# Patient Record
Sex: Male | Born: 1950 | Race: White | Hispanic: No | Marital: Married | Smoking: Former smoker
Health system: Southern US, Community
[De-identification: ages and names within clinical notes are randomized; demographics above are authoritative.]

## PROBLEM LIST (undated history)

## (undated) DIAGNOSIS — M7050 Other bursitis of knee, unspecified knee: Secondary | ICD-10-CM

## (undated) DIAGNOSIS — H6990 Unspecified Eustachian tube disorder, unspecified ear: Secondary | ICD-10-CM

## (undated) DIAGNOSIS — L409 Psoriasis, unspecified: Secondary | ICD-10-CM

## (undated) DIAGNOSIS — A6000 Herpesviral infection of urogenital system, unspecified: Secondary | ICD-10-CM

## (undated) DIAGNOSIS — G4700 Insomnia, unspecified: Secondary | ICD-10-CM

## (undated) DIAGNOSIS — J40 Bronchitis, not specified as acute or chronic: Secondary | ICD-10-CM

## (undated) DIAGNOSIS — E559 Vitamin D deficiency, unspecified: Secondary | ICD-10-CM

## (undated) DIAGNOSIS — K649 Unspecified hemorrhoids: Secondary | ICD-10-CM

## (undated) DIAGNOSIS — E785 Hyperlipidemia, unspecified: Secondary | ICD-10-CM

## (undated) DIAGNOSIS — K219 Gastro-esophageal reflux disease without esophagitis: Secondary | ICD-10-CM

## (undated) DIAGNOSIS — R5383 Other fatigue: Secondary | ICD-10-CM

## (undated) DIAGNOSIS — R319 Hematuria, unspecified: Secondary | ICD-10-CM

## (undated) DIAGNOSIS — I10 Essential (primary) hypertension: Secondary | ICD-10-CM

## (undated) DIAGNOSIS — N529 Male erectile dysfunction, unspecified: Secondary | ICD-10-CM

## (undated) DIAGNOSIS — H6980 Other specified disorders of Eustachian tube, unspecified ear: Secondary | ICD-10-CM

## (undated) DIAGNOSIS — G44009 Cluster headache syndrome, unspecified, not intractable: Secondary | ICD-10-CM

## (undated) DIAGNOSIS — B351 Tinea unguium: Secondary | ICD-10-CM

## (undated) HISTORY — DX: Male erectile dysfunction, unspecified: N52.9

## (undated) HISTORY — DX: Vitamin D deficiency, unspecified: E55.9

## (undated) HISTORY — DX: Herpesviral infection of urogenital system, unspecified: A60.00

## (undated) HISTORY — DX: Other specified disorders of Eustachian tube, unspecified ear: H69.80

## (undated) HISTORY — DX: Tinea unguium: B35.1

## (undated) HISTORY — DX: Hyperlipidemia, unspecified: E78.5

## (undated) HISTORY — PX: UMBILICAL HERNIA REPAIR: SHX196

## (undated) HISTORY — DX: Insomnia, unspecified: G47.00

## (undated) HISTORY — DX: Unspecified hemorrhoids: K64.9

## (undated) HISTORY — DX: Cluster headache syndrome, unspecified, not intractable: G44.009

## (undated) HISTORY — DX: Hematuria, unspecified: R31.9

## (undated) HISTORY — DX: Unspecified eustachian tube disorder, unspecified ear: H69.90

## (undated) HISTORY — PX: SHOULDER SURGERY: SHX246

## (undated) HISTORY — DX: Psoriasis, unspecified: L40.9

## (undated) HISTORY — DX: Other fatigue: R53.83

## (undated) HISTORY — DX: Essential (primary) hypertension: I10

## (undated) HISTORY — DX: Gastro-esophageal reflux disease without esophagitis: K21.9

## (undated) HISTORY — DX: Bronchitis, not specified as acute or chronic: J40

## (undated) HISTORY — DX: Other bursitis of knee, unspecified knee: M70.50

---

## 2005-11-27 ENCOUNTER — Ambulatory Visit: Payer: Self-pay | Admitting: Gastroenterology

## 2005-11-27 LAB — HM COLONOSCOPY: HM COLON: NORMAL

## 2006-06-23 ENCOUNTER — Ambulatory Visit: Payer: Self-pay | Admitting: Gastroenterology

## 2013-03-16 LAB — BASIC METABOLIC PANEL
BUN: 17 mg/dL (ref 4–21)
Creatinine: 1 mg/dL (ref <=1.3)
GLUCOSE: 95 mg/dL
Potassium: 4.7 mmol/L (ref 3.4–5.3)
SODIUM: 139 mmol/L (ref 137–147)

## 2013-03-16 LAB — HEPATIC FUNCTION PANEL
ALT: 28 U/L (ref 10–40)
AST: 22 U/L (ref 14–40)
Alkaline Phosphatase: 62 U/L (ref 25–125)
Bilirubin, Total: 0.5 mg/dL

## 2013-03-16 LAB — LIPID PANEL
Cholesterol: 211 mg/dL — AB (ref 0–200)
HDL: 45 mg/dL (ref 35–70)
LDL Cholesterol: 134 mg/dL
LDl/HDL Ratio: 3

## 2013-03-16 LAB — CBC AND DIFFERENTIAL
HCT: 42 % (ref 41–53)
Hemoglobin: 14.5 g/dL (ref 13.5–17.5)
PLATELETS: 187 10*3/uL (ref 150–399)
WBC: 5.7 10^3/mL

## 2013-03-16 LAB — TSH: TSH: 1.75 u[IU]/mL (ref <=5.90)

## 2013-06-19 ENCOUNTER — Ambulatory Visit: Payer: Self-pay | Admitting: Orthopedic Surgery

## 2014-05-31 ENCOUNTER — Ambulatory Visit
Admit: 2014-05-31 | Disposition: A | Discharge status: Home / Self Care | Payer: Self-pay | Attending: Gastroenterology | Admitting: Gastroenterology

## 2014-06-13 ENCOUNTER — Encounter: Payer: Self-pay | Admitting: Emergency Medicine

## 2014-07-19 ENCOUNTER — Ambulatory Visit (INDEPENDENT_AMBULATORY_CARE_PROVIDER_SITE_OTHER): Payer: 59 | Admitting: Family Medicine

## 2014-07-19 ENCOUNTER — Encounter: Payer: Self-pay | Admitting: Family Medicine

## 2014-07-19 VITALS — BP 130/80 | HR 72 | Temp 98.1°F | Resp 16 | Wt 198.0 lb

## 2014-07-19 DIAGNOSIS — K219 Gastro-esophageal reflux disease without esophagitis: Secondary | ICD-10-CM | POA: Diagnosis not present

## 2014-07-19 DIAGNOSIS — I10 Essential (primary) hypertension: Secondary | ICD-10-CM

## 2014-07-19 DIAGNOSIS — N529 Male erectile dysfunction, unspecified: Secondary | ICD-10-CM

## 2014-07-19 DIAGNOSIS — G4700 Insomnia, unspecified: Secondary | ICD-10-CM

## 2014-07-19 NOTE — Progress Notes (Signed)
Hypertension, follow-up:    BP Readings from Last 3 Encounters:  07/19/14 130/80  05/19/14 100/60     He was last seen for hypertension 1 years ago.   BP at that visit was 100/60.  Management changes since that visit include none. He reports good compliance with treatment.  He is not having side effects.   He is exercising.  He is adherent to low salt diet.    Outside blood pressures are not being checked.  He is experiencing none.   Patient denies none.    Cardiovascular risk factors include hypertension.    Weight trend: stable Current diet: not asked  Filed Vitals:   07/19/14 1551  BP: 130/80  Pulse: 72  Temp: 98.1 F (36.7 C)  Resp: 16   Name: Eduardo Patrick   MRN: [id number]    DOB: [birth year]   Date:07/19/2014        Progress Note  Subjective      Chief Complaint  Chief Complaint  Patient presents with  . Hypertension    HPI  Patient is a pleasant 64 year old gentleman that presents today for follow-up for hypertension. He is doing well and has no complaints. No cardiovascular at all. symptoms His blood pressures at home of been good. No problem-specific assessment & plan notes found for this encounter.   Past Medical History  Diagnosis Date  . GERD (gastroesophageal reflux disease)   . Hyperlipidemia   . Hypertension   . Psoriasis   . Hemorrhoid   . Onychomycosis   . Hematuria   . Erectile dysfunction   . Cluster headache   . Fatigue   . Bronchitis   . Vitamin D deficiency   . Insomnia   . Genital herpes   . Eustachian tube dysfunction   . Knee bursitis     Past Surgical History  Procedure Laterality Date  . Umbilical hernia repair    . Shoulder surgery      Family History  Problem Relation Age of Onset  . Hyperlipidemia Mother   . Glaucoma Mother   . Pulmonary fibrosis Mother   . Heart attack Father   . Other Brother     Enlarged prostate    History   Social History  . Marital Status: Married    Spouse Name: N/A   . Number of Children: N/A  . Years of Education: N/A   Occupational History  . Not on file.   Social History Main Topics  . Smoking status: Former Research scientist (life sciences)  . Smokeless tobacco: Not on file     Comment: Pt quit 36-38 years ago  . Alcohol Use: No  . Drug Use: No  . Sexual Activity: Not on file   Other Topics Concern  . Not on file   Social History Narrative     Current outpatient prescriptions:  .  acyclovir (ZOVIRAX) 200 MG capsule, Take 200 mg by mouth 2 (two) times daily., Disp: , Rfl:  .  cholecalciferol (VITAMIN D) 1000 UNITS tablet, Take 2,000 Units by mouth daily., Disp: , Rfl:  .  ibuprofen (ADVIL,MOTRIN) 800 MG tablet, Take 800 mg by mouth 3 (three) times daily., Disp: , Rfl:  .  lisinopril (PRINIVIL,ZESTRIL) 10 MG tablet, Take 10 mg by mouth every other day. , Disp: , Rfl:  .  Multiple Vitamin (MULTIVITAMIN) capsule, Take 1 capsule by mouth daily., Disp: , Rfl:  .  terbinafine (LAMISIL) 250 MG tablet, Take 250 mg by mouth daily., Disp: , Rfl:  .  LORazepam (ATIVAN) 0.5 MG tablet, Take 0.5 mg by mouth every 8 (eight) hours. 1/2 at bedtime, Disp: , Rfl:   Allergies  Allergen Reactions  . Sulfa Antibiotics Other (See Comments)    Unknown reaction     Review of Systems  Constitutional: Negative.   HENT: Negative.   Eyes: Negative.   Respiratory: Negative.   Cardiovascular: Negative.   Gastrointestinal: Negative.   Genitourinary: Negative.   Musculoskeletal: Negative.   Skin: Negative.   Neurological: Negative.   Endo/Heme/Allergies: Negative.   Psychiatric/Behavioral: Negative.       Objective  Filed Vitals:   07/19/14 1551  BP: 130/80  Pulse: 72  Temp: 98.1 F (36.7 C)  TempSrc: Oral  Resp: 16  Weight: 198 lb (89.812 kg)    Physical Exam  Constitutional: He is well-developed, well-nourished, and in no distress.  HENT:  Head: Normocephalic and atraumatic.  Eyes: Conjunctivae and EOM are normal. Pupils are equal, round, and reactive to light.   Neck: Normal range of motion. Neck supple.  Cardiovascular: Normal rate, regular rhythm, normal heart sounds and intact distal pulses.   Pulmonary/Chest: Effort normal and breath sounds normal.  Abdominal: Soft. Bowel sounds are normal.  Neurological: He is alert. Gait normal.  Skin: Skin is warm.  Psychiatric: Mood, memory, affect and judgment normal.  Vitals reviewed.      No results found for this or any previous visit (from the past 2160 hour(s)).   Assessment & Plan  Problem List Items Addressed This Visit      Cardiovascular and Mediastinum   Hypertension - Primary     Digestive   GERD (gastroesophageal reflux disease)     Genitourinary   Erectile dysfunction     Other   Insomnia     all problems stable. Recheck 6 months.  Meds ordered this encounter  Medications  . terbinafine (LAMISIL) 250 MG tablet    Sig: Take 250 mg by mouth daily.

## 2014-09-06 ENCOUNTER — Encounter: Payer: Self-pay | Admitting: Family Medicine

## 2014-09-06 ENCOUNTER — Ambulatory Visit (INDEPENDENT_AMBULATORY_CARE_PROVIDER_SITE_OTHER): Payer: 59 | Admitting: Family Medicine

## 2014-09-06 VITALS — BP 118/70 | HR 65 | Temp 98.1°F | Resp 16 | Wt 198.6 lb

## 2014-09-06 DIAGNOSIS — J069 Acute upper respiratory infection, unspecified: Secondary | ICD-10-CM | POA: Diagnosis not present

## 2014-09-06 NOTE — Progress Notes (Signed)
Subjective:     Patient ID: Eduardo Patrick, male   DOB: Apr 10, 1950, 64 y.o.   MRN: [id number]  HPI  Chief Complaint  Patient presents with  . Cough    Patient comes in office today with concerns of productive cough and congestion since Monday. Patient symptoms initially started out as sore throat and body chills, both of those symptoms are now gone. Patient reports feeling fatigued, yesterday he states he slept for 19hrs  Has not documented a fever at home: "I just came off a vacation". Has been using Mucinex and Robitussin DM for his sx.   Review of Systems     Objective:   Physical Exam  Constitutional: He appears well-developed and well-nourished. No distress.  Ears: T.M's intact without inflammation Throat: no tonsillar enlargement or exudate Neck: no cervical adenopathy Lungs: clear     Assessment:    1. Upper respiratory infection     Plan:    Discussed use of Mucinex D for congestion, Delsym for cough, and Benadryl for postnasal drainage

## 2014-09-06 NOTE — Patient Instructions (Signed)
Discussed use of Mucinex D for congestion, Delsym for cough, and Benadryl for postnasal drainage 

## 2014-09-07 ENCOUNTER — Telehealth: Payer: Self-pay | Admitting: Family Medicine

## 2014-09-07 ENCOUNTER — Other Ambulatory Visit: Payer: Self-pay | Admitting: Family Medicine

## 2014-09-07 DIAGNOSIS — J069 Acute upper respiratory infection, unspecified: Secondary | ICD-10-CM

## 2014-09-07 MED ORDER — HYDROCODONE-HOMATROPINE 5-1.5 MG/5ML PO SYRP: ORAL_SOLUTION | ORAL | Status: DC

## 2014-09-07 NOTE — Telephone Encounter (Signed)
Patient has been advised that Rx is available for pick up he will have his wife pick up Rx. KW

## 2014-09-07 NOTE — Telephone Encounter (Signed)
Pt called syaing he was still coughing a lot.  Was just in Thursday.  Needs a cough med.  Generic please.  Use [facility].  Eduardo Patrick

## 2014-09-07 NOTE — Telephone Encounter (Signed)
Patient was just seen in office yesterday, you diagnosed him with URI and advised he try OTC Benadryl, Mucinex and Delsym. I called patient and he states that he tried medications and the cough is staying persistent and is requesting at this time that you send in Rx for cough. Please advise. KW

## 2014-09-07 NOTE — Telephone Encounter (Signed)
Cough syrup prescription is up front for pickup

## 2014-09-10 ENCOUNTER — Other Ambulatory Visit: Payer: Self-pay | Admitting: Family Medicine

## 2014-09-10 DIAGNOSIS — J069 Acute upper respiratory infection, unspecified: Secondary | ICD-10-CM

## 2014-09-10 MED ORDER — HYDROCODONE-HOMATROPINE 5-1.5 MG/5ML PO SYRP: ORAL_SOLUTION | ORAL | Status: DC

## 2015-01-21 ENCOUNTER — Ambulatory Visit (INDEPENDENT_AMBULATORY_CARE_PROVIDER_SITE_OTHER): Payer: 59 | Admitting: Family Medicine

## 2015-01-21 VITALS — BP 128/80 | HR 64 | Temp 98.4°F | Resp 16 | Wt 203.0 lb

## 2015-01-21 DIAGNOSIS — M791 Myalgia, unspecified site: Secondary | ICD-10-CM

## 2015-01-21 DIAGNOSIS — K651 Peritoneal abscess: Secondary | ICD-10-CM

## 2015-01-21 DIAGNOSIS — R059 Cough, unspecified: Secondary | ICD-10-CM

## 2015-01-21 DIAGNOSIS — R1013 Epigastric pain: Secondary | ICD-10-CM | POA: Diagnosis not present

## 2015-01-21 DIAGNOSIS — G4700 Insomnia, unspecified: Secondary | ICD-10-CM | POA: Diagnosis not present

## 2015-01-21 DIAGNOSIS — K219 Gastro-esophageal reflux disease without esophagitis: Secondary | ICD-10-CM | POA: Diagnosis not present

## 2015-01-21 DIAGNOSIS — I10 Essential (primary) hypertension: Secondary | ICD-10-CM | POA: Diagnosis not present

## 2015-01-21 DIAGNOSIS — IMO0002 Reserved for concepts with insufficient information to code with codable children: Secondary | ICD-10-CM

## 2015-01-21 DIAGNOSIS — R0781 Pleurodynia: Secondary | ICD-10-CM | POA: Diagnosis not present

## 2015-01-21 DIAGNOSIS — R05 Cough: Secondary | ICD-10-CM

## 2015-01-21 MED ORDER — NAPROXEN 500 MG PO TABS: 500.0000 mg | ORAL_TABLET | Freq: Two times a day (BID) | ORAL | Status: DC

## 2015-01-21 NOTE — Progress Notes (Signed)
Patient ID: Eduardo Patrick, male   DOB: 07/21/1950, 64 y.o.   MRN: [id number]   Eduardo Patrick  MRN: [id number] DOB: February 11, 1951  Subjective:  HPI   1. Essential hypertension The patient is a 64 year old male who presents for follow up of his hypertension.  He is currently taking Lisinopril and reports good compliance and tolerance.    2. Gastroesophageal reflux disease, esophagitis presence not specified He is also here to follow up on GERD.  He states he has not been having any problems as long as he does not go up on his weight.  He is not currently on any medication.  3. Insomnia Patient had been using Lorazepam for sleep.  He has been off of it for about 1 year.  He is using an OTC medication for sleep and it helps.  He is taking Sominex.  4. Rib pain The patient complains of having anterior and posterior rib pain.  He states he has been having it for about 1 month.  His back hurts mostly in the morning but his anterior ribs hurt all day.  He feels he is coming down with a cold and said that when he takes a deep breath it hurts.  He then explained that he feels it when he expands his chest.  He denies any difficulty breathing.   Patient Active Problem List   Diagnosis Date Noted  . Hypertension 07/19/2014  . GERD (gastroesophageal reflux disease) 07/19/2014  . Erectile dysfunction 07/19/2014  . Insomnia 07/19/2014    Past Medical History  Diagnosis Date  . GERD (gastroesophageal reflux disease)   . Hyperlipidemia   . Hypertension   . Psoriasis   . Hemorrhoid   . Onychomycosis   . Hematuria   . Erectile dysfunction   . Cluster headache   . Fatigue   . Bronchitis   . Vitamin D deficiency   . Insomnia   . Genital herpes   . Eustachian tube dysfunction   . Knee bursitis     Social History   Social History  . Marital Status: Married    Spouse Name: N/A  . Number of Children: N/A  . Years of Education: N/A   Occupational History  . Not on file.   Social  History Main Topics  . Smoking status: Former Research scientist (life sciences)  . Smokeless tobacco: Not on file     Comment: Pt quit 36-38 years ago  . Alcohol Use: No  . Drug Use: No  . Sexual Activity: Not on file   Other Topics Concern  . Not on file   Social History Narrative    Outpatient Prescriptions Prior to Visit  Medication Sig Dispense Refill  . acyclovir (ZOVIRAX) 200 MG capsule Take 200 mg by mouth 2 (two) times daily.    . cholecalciferol (VITAMIN D) 1000 UNITS tablet Take 2,000 Units by mouth daily.    Marland Kitchen ibuprofen (ADVIL,MOTRIN) 800 MG tablet Take 800 mg by mouth 3 (three) times daily.    Marland Kitchen lisinopril (PRINIVIL,ZESTRIL) 10 MG tablet Take 10 mg by mouth every other day.     . Multiple Vitamin (MULTIVITAMIN) capsule Take 1 capsule by mouth daily.    Marland Kitchen HYDROcodone-homatropine (HYCODAN) 5-1.5 MG/5ML syrup 5 ml.  4-6 hours as needed for cough 240 mL 0  . LORazepam (ATIVAN) 0.5 MG tablet Take 0.5 mg by mouth every 8 (eight) hours. 1/2 at bedtime     No facility-administered medications prior to visit.    Allergies  Allergen  Reactions  . Sulfa Antibiotics Other (See Comments)    Unknown reaction    Review of Systems  Constitutional: Negative for fever, chills, weight loss, malaise/fatigue and diaphoresis.  HENT: Positive for congestion and sore throat (scratchy throat). Negative for ear discharge, ear pain, hearing loss, nosebleeds and tinnitus.   Eyes: Negative for blurred vision, double vision, photophobia, pain, discharge and redness.  Respiratory: Positive for cough (Patient reports having a cough that is productive in the morning.  He also states that prior to developing the cough he would cough from a tickle all the time.). Negative for hemoptysis, sputum production, shortness of breath and wheezing.   Cardiovascular: Negative for chest pain, palpitations, orthopnea and leg swelling.  Gastrointestinal: Negative.   Musculoskeletal: Positive for myalgias (In the rib area).  Neurological:  Negative for weakness and headaches.  Endo/Heme/Allergies: Negative.   Psychiatric/Behavioral: Negative.    Objective:  BP 128/80 mmHg  Pulse 64  Temp(Src) 98.4 F (36.9 C) (Oral)  Resp 16  Wt 203 lb (92.08 kg)  Physical Exam  Constitutional: He is oriented to person, place, and time and well-developed, well-nourished, and in no distress.  HENT:  Head: Normocephalic and atraumatic.  Right Ear: External ear normal.  Left Ear: External ear normal.  Nose: Nose normal.  Mouth/Throat: Oropharynx is clear and moist.  Eyes: Conjunctivae and EOM are normal. Pupils are equal, round, and reactive to light.  Neck: Normal range of motion. Neck supple.  Cardiovascular: Normal rate, regular rhythm and normal heart sounds.   Pulmonary/Chest: Effort normal and breath sounds normal.  Abdominal: Soft.  Neurological: He is alert and oriented to person, place, and time. Gait normal.  Skin: Skin is warm and dry.  Psychiatric: Mood, memory, affect and judgment normal.    Assessment and Plan :  1. Essential hypertension  - CBC With Differential/Platelet - COMPLETE METABOLIC PANEL WITH GFR  2. Gastroesophageal reflux disease, esophagitis presence not specified   3. Insomnia Chronic   4. Rib pain  - DG Chest 2 View; Future - naproxen (NAPROSYN) 500 MG tablet; Take 1 tablet (500 mg total) by mouth 2 (two) times daily with a meal.  Dispense: 60 tablet; Refill: 3  5. Myalgia  - CK  6. Cough  - DG Chest 2 View; Future RTC 1 month.  7. Abdominal pain/unlikely but possible pancreatitis (HCC)  Lipase    I have done the exam and reviewed the above chart and it is accurate to the best of my knowledge.  Miguel Aschoff MD [facility] 01/21/2015 4:06 PM

## 2015-01-22 LAB — COMPREHENSIVE METABOLIC PANEL
A/G RATIO: 2 (ref 1.1–2.5)
ALBUMIN: 4.5 g/dL (ref 3.6–4.8)
ALT: 26 IU/L (ref 0–44)
AST: 22 IU/L (ref 0–40)
Alkaline Phosphatase: 74 IU/L (ref 39–117)
BUN / CREAT RATIO: 15 (ref 10–22)
BUN: 12 mg/dL (ref 8–27)
Bilirubin Total: 0.2 mg/dL (ref 0.0–1.2)
CALCIUM: 9.5 mg/dL (ref 8.6–10.2)
CO2: 24 mmol/L (ref 18–29)
CREATININE: 0.82 mg/dL (ref 0.76–1.27)
Chloride: 99 mmol/L (ref 97–106)
GFR, EST AFRICAN AMERICAN: 109 mL/min/{1.73_m2} (ref >59)
GFR, EST NON AFRICAN AMERICAN: 94 mL/min/{1.73_m2} (ref >59)
GLOBULIN, TOTAL: 2.3 g/dL (ref 1.5–4.5)
Glucose: 97 mg/dL (ref 65–99)
POTASSIUM: 4.1 mmol/L (ref 3.5–5.2)
SODIUM: 139 mmol/L (ref 136–144)
TOTAL PROTEIN: 6.8 g/dL (ref 6.0–8.5)

## 2015-01-22 LAB — CBC WITH DIFFERENTIAL/PLATELET
BASOS: 0 %
Basophils Absolute: 0 10*3/uL (ref 0.0–0.2)
EOS (ABSOLUTE): 0.4 10*3/uL (ref 0.0–0.4)
Eos: 4 %
HEMATOCRIT: 40.4 % (ref 37.5–51.0)
Hemoglobin: 14.3 g/dL (ref 12.6–17.7)
Immature Grans (Abs): 0 10*3/uL (ref 0.0–0.1)
Immature Granulocytes: 0 %
LYMPHS ABS: 2 10*3/uL (ref 0.7–3.1)
Lymphs: 24 %
MCH: 32.9 pg (ref 26.6–33.0)
MCHC: 35.4 g/dL (ref 31.5–35.7)
MCV: 93 fL (ref 79–97)
MONOS ABS: 0.7 10*3/uL (ref 0.1–0.9)
Monocytes: 9 %
Neutrophils Absolute: 5.3 10*3/uL (ref 1.4–7.0)
Neutrophils: 63 %
Platelets: 180 10*3/uL (ref 150–379)
RBC: 4.35 x10E6/uL (ref 4.14–5.80)
RDW: 12.9 % (ref 12.3–15.4)
WBC: 8.4 10*3/uL (ref 3.4–10.8)

## 2015-01-22 LAB — CK: CK TOTAL: 112 U/L (ref 24–204)

## 2015-01-22 LAB — LIPASE: Lipase: 34 U/L (ref 0–59)

## 2015-01-23 ENCOUNTER — Telehealth: Payer: Self-pay | Admitting: Family Medicine

## 2015-01-23 NOTE — Telephone Encounter (Signed)
Pt would like Elena to return his call. Pt stated that the medication he started on 01/22/15 has started helping and he would like to discuss opting out of the chest xray and would also like to discuss the medications. Thanks TNP

## 2015-01-23 NOTE — Telephone Encounter (Signed)
Mucinex DM and lots of water

## 2015-01-23 NOTE — Telephone Encounter (Signed)
Pt advised on voicemail-aa 

## 2015-01-23 NOTE — Telephone Encounter (Signed)
Wanted to let us know that medication he was given for pain and he is feeling great as far as the pain in the ribs. He states that you guys discussed his cold symptoms-he is hoarse now his voice is in and out, feels like things are settled more in the chest over night, He saw [place] 2 days ago 12/5. He has used Mucinnex Dm any other suggestions? Any medication or OTC?-aa

## 2015-02-20 ENCOUNTER — Ambulatory Visit: Payer: 59 | Admitting: Family Medicine

## 2015-02-25 ENCOUNTER — Ambulatory Visit: Payer: 59 | Admitting: Family Medicine

## 2015-02-28 ENCOUNTER — Ambulatory Visit (INDEPENDENT_AMBULATORY_CARE_PROVIDER_SITE_OTHER): Payer: 59 | Admitting: Family Medicine

## 2015-02-28 ENCOUNTER — Encounter: Payer: Self-pay | Admitting: Family Medicine

## 2015-02-28 VITALS — BP 132/68 | HR 70 | Temp 98.1°F | Resp 16 | Wt 207.0 lb

## 2015-02-28 DIAGNOSIS — R0781 Pleurodynia: Secondary | ICD-10-CM | POA: Diagnosis not present

## 2015-02-28 MED ORDER — LISINOPRIL 10 MG PO TABS: 10.0000 mg | ORAL_TABLET | Freq: Every day | ORAL | Status: DC

## 2015-02-28 MED ORDER — ACYCLOVIR 200 MG PO CAPS: 200.0000 mg | ORAL_CAPSULE | Freq: Two times a day (BID) | ORAL | Status: AC

## 2015-02-28 NOTE — Progress Notes (Signed)
Patient ID: Eduardo Patrick, male   DOB: 19-Jun-1950, 65 y.o.   MRN: [id number]    Subjective:  HPI Pt is here for a follow up of rib pain. He is still taking the Naproxen that was prescribed and it makes the pain resolved but when he does not take it the pain comes back. He did not go get his chest xray because the pain got better and he thought he did not need it. The cough and URI has resolved.  Prior to Admission medications   Medication Sig Start Date End Date Taking? Authorizing Provider  acyclovir (ZOVIRAX) 200 MG capsule Take 200 mg by mouth 2 (two) times daily.   Yes Historical Provider, MD  cholecalciferol (VITAMIN D) 1000 UNITS tablet Take 2,000 Units by mouth daily.   Yes Historical Provider, MD  ibuprofen (ADVIL,MOTRIN) 800 MG tablet Take 800 mg by mouth 3 (three) times daily.   Yes Historical Provider, MD  lisinopril (PRINIVIL,ZESTRIL) 10 MG tablet Take 10 mg by mouth every other day.    Yes Historical Provider, MD  Multiple Vitamin (MULTIVITAMIN) capsule Take 1 capsule by mouth daily.   Yes Historical Provider, MD  naproxen (NAPROSYN) 500 MG tablet Take 1 tablet (500 mg total) by mouth 2 (two) times daily with a meal. 01/21/15  Yes Jerrol Banana., MD    Patient Active Problem List   Diagnosis Date Noted  . Hypertension 07/19/2014  . GERD (gastroesophageal reflux disease) 07/19/2014  . Erectile dysfunction 07/19/2014  . Insomnia 07/19/2014    Past Medical History  Diagnosis Date  . GERD (gastroesophageal reflux disease)   . Hyperlipidemia   . Hypertension   . Psoriasis   . Hemorrhoid   . Onychomycosis   . Hematuria   . Erectile dysfunction   . Cluster headache   . Fatigue   . Bronchitis   . Vitamin D deficiency   . Insomnia   . Genital herpes   . Eustachian tube dysfunction   . Knee bursitis     Social History   Social History  . Marital Status: Married    Spouse Name: N/A  . Number of Children: N/A  . Years of Education: N/A   Occupational  History  . Not on file.   Social History Main Topics  . Smoking status: Former Research scientist (life sciences)  . Smokeless tobacco: Not on file     Comment: Pt quit 36-38 years ago  . Alcohol Use: No  . Drug Use: No  . Sexual Activity: Not on file   Other Topics Concern  . Not on file   Social History Narrative    Allergies  Allergen Reactions  . Sulfa Antibiotics Other (See Comments)    Unknown reaction    Review of Systems  Constitutional: Negative.   HENT: Negative.   Eyes: Negative.   Respiratory: Negative.   Cardiovascular: Negative.        Rib pain  Gastrointestinal: Negative.   Genitourinary: Negative.   Musculoskeletal: Negative.   Skin: Negative.   Neurological: Negative.   Endo/Heme/Allergies: Negative.   Psychiatric/Behavioral: Negative.     Immunization History  Administered Date(s) Administered  . Influenza-Unspecified 02/01/2015  . Tdap 08/10/2007  . Zoster 02/16/2012   Objective:  BP 132/68 mmHg  Pulse 70  Temp(Src) 98.1 F (36.7 C) (Oral)  Resp 16  Wt 207 lb (93.895 kg)  SpO2 96%  Physical Exam  Constitutional: He is oriented to person, place, and time and well-developed, well-nourished, and in no distress.  Eyes: Conjunctivae and EOM are normal. Pupils are equal, round, and reactive to light.  Neck: Normal range of motion. Neck supple.  Cardiovascular: Normal rate, regular rhythm, normal heart sounds and intact distal pulses.   Pulmonary/Chest: Effort normal and breath sounds normal.  Abdominal: Soft. Bowel sounds are normal.  Musculoskeletal: Normal range of motion.  Neurological: He is alert and oriented to person, place, and time. He has normal reflexes. Gait normal. GCS score is 15.  Skin: Skin is warm and dry.  Psychiatric: Mood, memory, affect and judgment normal.    Lab Results  Component Value Date   WBC 8.4 01/21/2015   HGB 14.5 03/16/2013   HCT 40.4 01/21/2015   PLT 180 01/21/2015   GLUCOSE 97 01/21/2015   CHOL 211* 03/16/2013   HDL 45  03/16/2013   LDLCALC 134 03/16/2013   TSH 1.75 03/16/2013    CMP     Component Value Date/Time   NA 139 01/21/2015 1652   K 4.1 01/21/2015 1652   CL 99 01/21/2015 1652   CO2 24 01/21/2015 1652   GLUCOSE 97 01/21/2015 1652   BUN 12 01/21/2015 1652   CREATININE 0.82 01/21/2015 1652   CREATININE 1.0 03/16/2013   CALCIUM 9.5 01/21/2015 1652   PROT 6.8 01/21/2015 1652   ALBUMIN 4.5 01/21/2015 1652   AST 22 01/21/2015 1652   ALT 26 01/21/2015 1652   ALKPHOS 74 01/21/2015 1652   BILITOT 0.2 01/21/2015 1652   GFRNONAA 94 01/21/2015 1652   GFRAA 109 01/21/2015 1652    Assessment and Plan :  1. Rib pain- Most likely musculoskeletal, possibly thoracic DDD. Take 1 more month of Naproxen and stop. Call if not improving. Just with patient that this could be referred pain from an internal organ and he will let us know if this does not resolve over the next few weeks. She just said he could be liver, pancreas or less likely gallbladder or renal.  I have done the exam and reviewed the above chart and it is accurate to the best of my knowledge.  Patient was seen and examined by Dr. Miguel Aschoff, and noted scribed by Webb Laws, [place] MD [facility] 02/28/2015 3:33 PM

## 2017-01-20 DIAGNOSIS — S46011A Strain of muscle(s) and tendon(s) of the rotator cuff of right shoulder, initial encounter: Secondary | ICD-10-CM | POA: Diagnosis not present

## 2017-01-30 DIAGNOSIS — S46011A Strain of muscle(s) and tendon(s) of the rotator cuff of right shoulder, initial encounter: Secondary | ICD-10-CM | POA: Diagnosis not present

## 2017-02-03 DIAGNOSIS — M7541 Impingement syndrome of right shoulder: Secondary | ICD-10-CM | POA: Diagnosis not present

## 2017-05-25 DIAGNOSIS — N528 Other male erectile dysfunction: Secondary | ICD-10-CM | POA: Diagnosis not present

## 2017-05-25 DIAGNOSIS — Z0000 Encounter for general adult medical examination without abnormal findings: Secondary | ICD-10-CM | POA: Diagnosis not present

## 2017-05-25 DIAGNOSIS — I10 Essential (primary) hypertension: Secondary | ICD-10-CM | POA: Diagnosis not present

## 2017-05-25 DIAGNOSIS — R5381 Other malaise: Secondary | ICD-10-CM | POA: Diagnosis not present

## 2017-05-25 DIAGNOSIS — R5383 Other fatigue: Secondary | ICD-10-CM | POA: Diagnosis not present

## 2017-05-25 DIAGNOSIS — E782 Mixed hyperlipidemia: Secondary | ICD-10-CM | POA: Diagnosis not present

## 2017-08-23 ENCOUNTER — Other Ambulatory Visit: Payer: Self-pay | Admitting: Family Medicine

## 2017-08-23 DIAGNOSIS — R1031 Right lower quadrant pain: Secondary | ICD-10-CM | POA: Diagnosis not present

## 2017-08-24 ENCOUNTER — Ambulatory Visit
Admission: RE | Admit: 2017-08-24 | Discharge: 2017-08-24 | Disposition: A | Discharge status: Home / Self Care | Payer: PPO | Source: Ambulatory Visit | Attending: Family Medicine | Admitting: Family Medicine

## 2017-08-24 DIAGNOSIS — I2510 Atherosclerotic heart disease of native coronary artery without angina pectoris: Secondary | ICD-10-CM | POA: Diagnosis not present

## 2017-08-24 DIAGNOSIS — I700 Atherosclerosis of aorta: Secondary | ICD-10-CM | POA: Insufficient documentation

## 2017-08-24 DIAGNOSIS — N200 Calculus of kidney: Secondary | ICD-10-CM | POA: Diagnosis not present

## 2017-08-24 DIAGNOSIS — M47815 Spondylosis without myelopathy or radiculopathy, thoracolumbar region: Secondary | ICD-10-CM | POA: Diagnosis not present

## 2017-08-24 DIAGNOSIS — R1031 Right lower quadrant pain: Secondary | ICD-10-CM | POA: Diagnosis not present

## 2017-08-24 DIAGNOSIS — K760 Fatty (change of) liver, not elsewhere classified: Secondary | ICD-10-CM | POA: Diagnosis not present

## 2017-08-24 DIAGNOSIS — N4289 Other specified disorders of prostate: Secondary | ICD-10-CM | POA: Diagnosis not present

## 2017-08-24 MED ORDER — IOHEXOL 300 MG/ML  SOLN
100.0000 mL | Freq: Once | INTRAMUSCULAR | Status: AC | PRN
  Administered 2017-08-24: 100 mL via INTRAVENOUS

## 2017-08-25 DIAGNOSIS — K4020 Bilateral inguinal hernia, without obstruction or gangrene, not specified as recurrent: Secondary | ICD-10-CM | POA: Diagnosis not present

## 2017-08-30 ENCOUNTER — Inpatient Hospital Stay: Admission: RE | Admit: 2017-08-30 | Payer: PPO | Source: Ambulatory Visit

## 2017-08-30 ENCOUNTER — Ambulatory Visit: Payer: Self-pay | Admitting: General Surgery

## 2017-08-30 NOTE — H&P (Signed)
PATIENT PROFILE: Eduardo Patrick is a 67 y.o. male who presents to the Clinic for consultation at the request of Dr. Venetia Maxon for evaluation of right inguinal hernia.  PCP:  Azzie Glatter, MD  HISTORY OF PRESENT ILLNESS: [name] reports he has been having pain on the right lower quadrant since months ago. He specifically refers that more than pain is discomfort on the right inguinal area. The discomfort is most of the time when he is going to urinate that he feels "the intestines grumbling". Pain is not aggravated by heavy lifting. Pain is improved with resting. Does not needs pain medications. Pain does not radiates to other part of the body. Denies fever or chills. Denies abdominal distention, nausea or vomiting.    PROBLEM LIST:         Problem List  Date Reviewed: 05/25/2017         Noted   Essential hypertension, benign 02/03/2016   Other male erectile dysfunction 02/03/2016   Low serum vitamin D 02/03/2016      GENERAL REVIEW OF SYSTEMS:   General ROS: negative for - chills, fatigue, fever, weight gain or weight loss Allergy and Immunology ROS: negative for - hives  Hematological and Lymphatic ROS: negative for - bleeding problems or bruising, negative for palpable nodes Endocrine ROS: negative for - heat or cold intolerance, hair changes Respiratory ROS: negative for - cough, shortness of breath or wheezing Cardiovascular ROS: no chest pain or palpitations GI ROS: negative for nausea, vomiting, abdominal pain, diarrhea, constipation Musculoskeletal ROS: negative for - joint swelling or muscle pain Neurological ROS: negative for - confusion, syncope Dermatological ROS: negative for pruritus and rash Psychiatric: negative for anxiety, depression, difficulty sleeping and memory loss  MEDICATIONS: CurrentMedications  Current Outpatient Medications  Medication Sig Dispense Refill  . acyclovir (ZOVIRAX) 200 MG capsule Take 1 capsule (200 mg  total) by mouth 2 (two) times daily 60 capsule 3  . cholecalciferol (VITAMIN D3) 2,000 unit tablet Take 5,000 Units by mouth once daily.    . diphenhydrAMINE (UNISOM SLEEPGELS) 50 MG capsule Take 50 mg by mouth nightly.    . hydroCHLOROthiazide (MICROZIDE) 12.5 mg capsule Take 1 capsule (12.5 mg total) by mouth once daily 90 capsule 1  . multivitamin tablet Take 1 tablet by mouth once daily.    . rosuvastatin (CRESTOR) 20 MG tablet Take 1 tablet (20 mg total) by mouth once daily 30 tablet 5   No current facility-administered medications for this visit.       ALLERGIES: Sulfa (sulfonamide antibiotics)  PAST MEDICAL HISTORY:     Past Medical History:  Diagnosis Date  . HTN (hypertension)     PAST SURGICAL HISTORY:      Past Surgical History:  Procedure Laterality Date  . 3 shoulder surgeries    . UMBILICAL HERNIA REPAIR       FAMILY HISTORY: No family history on file.   SOCIAL HISTORY: Social History          Socioeconomic History  . Marital status: Married    Spouse name: Not on file  . Number of children: Not on file  . Years of education: Not on file  . Highest education level: Not on file  Occupational History  . Not on file  Social Needs  . Financial resource strain: Not on file  . Food insecurity:    Worry: Not on file    Inability: Not on file  . Transportation needs:    Medical: Not on file  Non-medical: Not on file  Tobacco Use  . Smoking status: Former Research scientist (life sciences)  . Smokeless tobacco: Never Used  . Tobacco comment: pt quit in 1979  Substance and Sexual Activity  . Alcohol use: Yes  . Drug use: Defer  . Sexual activity: Defer  Other Topics Concern  . Not on file  Social History Narrative  . Not on file      PHYSICAL EXAM:    Vitals:   08/25/17 1538  BP: 147/88  Pulse: 66  Temp: 37 C (98.6 F)   Body mass index is 29.71 kg/m. Weight: 96.6 kg (213 lb)   GENERAL: Alert, active, oriented x3  HEENT:  Pupils equal reactive to light. Extraocular movements are intact. Sclera clear. Palpebral conjunctiva normal red color.Pharynx clear.  NECK: Supple with no palpable mass and no adenopathy.  LUNGS: Sound clear with no rales rhonchi or wheezes.  HEART: Regular rhythm S1 and S2 without murmur.  ABDOMEN: Soft and depressible, nontender with no palpable mass, no hepatomegaly. Right inguinal hernia easily reducible. No palpable inguinal hernia. No tenderness on right lower quadrant.   EXTREMITIES: Well-developed well-nourished symmetrical with no dependent edema.  NEUROLOGICAL: Awake alert oriented, facial expression symmetrical, moving all extremities.  REVIEW OF DATA: I have reviewed the following data today:      Office Visit on 08/23/2017  Component Date Value  . WBC (White Blood Cell Co* 08/23/2017 6.5   . RBC (Red Blood Cell Coun* 08/23/2017 4.70   . Hemoglobin 08/23/2017 15.5   . Hematocrit 08/23/2017 44.2   . MCV (Mean Corpuscular Vo* 08/23/2017 94.0   . MCH (Mean Corpuscular He* 08/23/2017 33.0*  . MCHC (Mean Corpuscular H* 08/23/2017 35.1   . Platelet Count 08/23/2017 176   . RDW-CV (Red Cell Distrib* 08/23/2017 11.9   . MPV (Mean Platelet Volum* 08/23/2017 10.1   . Neutrophils 08/23/2017 3.56   . Lymphocytes 08/23/2017 2.01   . Monocytes 08/23/2017 0.67   . Eosinophils 08/23/2017 0.25   . Basophils 08/23/2017 0.04   . Neutrophil % 08/23/2017 54.5   . Lymphocyte % 08/23/2017 30.7   . Monocyte % 08/23/2017 10.2   . Eosinophil % 08/23/2017 3.8   . Basophil% 08/23/2017 0.6   . Immature Granulocyte % 08/23/2017 0.2   . Immature Granulocyte Cou* 08/23/2017 0.01   . Glucose 08/23/2017 105   . Sodium 08/23/2017 139   . Potassium 08/23/2017 4.0   . Chloride 08/23/2017 102   . Carbon Dioxide (CO2) 08/23/2017 30.0   . Urea Nitrogen (BUN) 08/23/2017 13   . Creatinine 08/23/2017 1.0   . Glomerular Filtration Ra* 08/23/2017 75   . Calcium 08/23/2017 9.4   . AST   08/23/2017 22   . ALT  08/23/2017 28   . Alk Phos (alkaline Phosp* 08/23/2017 46   . Albumin 08/23/2017 4.6   . Bilirubin, Total 08/23/2017 0.5   . Protein, Total 08/23/2017 6.9   . A/G Ratio 08/23/2017 2.0     Abdominopelvic CT scan personally reviewed. The appendix appears normal but inside the inguinal canal. The cecum is on the abdominal cavity. There is no mention on the report about a left inguinal hernia that is appreciated on the images.   ASSESSMENT: Mr. Dues is a 67 y.o. male presenting for consultation for ight inguinal hernia with appendix inside the hernia.    The patient presents with a symptomatic, reducible right inguinal hernia and asymptomatic left inguinal hernia seen on CT Scan.  Patient was oriented about the diagnosis  of bilateral inguinal hernia and its implication. The patient was oriented about the treatment alternatives (observation vs surgical repair). Due to patient symptoms, repair is recommended. Patient was oriented about the possibility of repairing the left inguinal hernia in the same surgery even though is asymptomatic. Patient agreed and understood the benefits and risk of repair both hernias. Patient oriented about the surgical procedure which in his case with the appendix in the inguinal canal will be more challenging, the use of mesh and its risk of complications such as: infection, bleeding, injury to vas deference, vasculature and testicle, injury to bowel or bladder, and chronic pain.  Again, due to the appendix herniating through the inguinal canal it was recommended to perform laparoscopic TAP approach for repair.   PLAN: 1. Laparoscopic bilateral inguinal hernia repair with mesh (76720) 2. CBC, CMP done (08/23/17) 3. Does not take any aspirin 5 days before surgery 4. Contact us if has any question or concer  Patient and his wife verbalized understanding, all questions were answered, and were agreeable with the plan outlined above.     Herbert Pun, MD  Electronically signed by Herbert Pun, MD

## 2017-08-30 NOTE — H&P (View-Only) (Signed)
PATIENT PROFILE: Eduardo Patrick is a 67 y.o. male who presents to the Clinic for consultation at the request of Dr. Venetia Maxon for evaluation of right inguinal hernia.  PCP:  Azzie Glatter, MD  HISTORY OF PRESENT ILLNESS: Eduardo Patrick reports he has been having pain on the right lower quadrant since months ago. He specifically refers that more than pain is discomfort on the right inguinal area. The discomfort is most of the time when he is going to urinate that he feels "the intestines grumbling". Pain is not aggravated by heavy lifting. Pain is improved with resting. Does not needs pain medications. Pain does not radiates to other part of the body. Denies fever or chills. Denies abdominal distention, nausea or vomiting.    PROBLEM LIST:         Problem List  Date Reviewed: 05/25/2017         Noted   Essential hypertension, benign 02/03/2016   Other male erectile dysfunction 02/03/2016   Low serum vitamin D 02/03/2016      GENERAL REVIEW OF SYSTEMS:   General ROS: negative for - chills, fatigue, fever, weight gain or weight loss Allergy and Immunology ROS: negative for - hives  Hematological and Lymphatic ROS: negative for - bleeding problems or bruising, negative for palpable nodes Endocrine ROS: negative for - heat or cold intolerance, hair changes Respiratory ROS: negative for - cough, shortness of breath or wheezing Cardiovascular ROS: no chest pain or palpitations GI ROS: negative for nausea, vomiting, abdominal pain, diarrhea, constipation Musculoskeletal ROS: negative for - joint swelling or muscle pain Neurological ROS: negative for - confusion, syncope Dermatological ROS: negative for pruritus and rash Psychiatric: negative for anxiety, depression, difficulty sleeping and memory loss  MEDICATIONS: CurrentMedications  Current Outpatient Medications  Medication Sig Dispense Refill  . acyclovir (ZOVIRAX) 200 MG capsule Take 1 capsule (200 mg  total) by mouth 2 (two) times daily 60 capsule 3  . cholecalciferol (VITAMIN D3) 2,000 unit tablet Take 5,000 Units by mouth once daily.    . diphenhydrAMINE (UNISOM SLEEPGELS) 50 MG capsule Take 50 mg by mouth nightly.    . hydroCHLOROthiazide (MICROZIDE) 12.5 mg capsule Take 1 capsule (12.5 mg total) by mouth once daily 90 capsule 1  . multivitamin tablet Take 1 tablet by mouth once daily.    . rosuvastatin (CRESTOR) 20 MG tablet Take 1 tablet (20 mg total) by mouth once daily 30 tablet 5   No current facility-administered medications for this visit.       ALLERGIES: Sulfa (sulfonamide antibiotics)  PAST MEDICAL HISTORY:     Past Medical History:  Diagnosis Date  . HTN (hypertension)     PAST SURGICAL HISTORY:      Past Surgical History:  Procedure Laterality Date  . 3 shoulder surgeries    . UMBILICAL HERNIA REPAIR       FAMILY HISTORY: No family history on file.   SOCIAL HISTORY: Social History          Socioeconomic History  . Marital status: Married    Spouse name: Not on file  . Number of children: Not on file  . Years of education: Not on file  . Highest education level: Not on file  Occupational History  . Not on file  Social Needs  . Financial resource strain: Not on file  . Food insecurity:    Worry: Not on file    Inability: Not on file  . Transportation needs:    Medical: Not on file  Non-medical: Not on file  Tobacco Use  . Smoking status: Former Research scientist (life sciences)  . Smokeless tobacco: Never Used  . Tobacco comment: pt quit in 1979  Substance and Sexual Activity  . Alcohol use: Yes  . Drug use: Defer  . Sexual activity: Defer  Other Topics Concern  . Not on file  Social History Narrative  . Not on file      PHYSICAL EXAM:    Vitals:   08/25/17 1538  BP: 147/88  Pulse: 66  Temp: 37 C (98.6 F)   Body mass index is 29.71 kg/m. Weight: 96.6 kg (213 lb)   GENERAL: Alert, active, oriented x3  HEENT:  Pupils equal reactive to light. Extraocular movements are intact. Sclera clear. Palpebral conjunctiva normal red color.Pharynx clear.  NECK: Supple with no palpable mass and no adenopathy.  LUNGS: Sound clear with no rales rhonchi or wheezes.  HEART: Regular rhythm S1 and S2 without murmur.  ABDOMEN: Soft and depressible, nontender with no palpable mass, no hepatomegaly. Right inguinal hernia easily reducible. No palpable inguinal hernia. No tenderness on right lower quadrant.   EXTREMITIES: Well-developed well-nourished symmetrical with no dependent edema.  NEUROLOGICAL: Awake alert oriented, facial expression symmetrical, moving all extremities.  REVIEW OF DATA: I have reviewed the following data today:      Office Visit on 08/23/2017  Component Date Value  . WBC (White Blood Cell Co* 08/23/2017 6.5   . RBC (Red Blood Cell Coun* 08/23/2017 4.70   . Hemoglobin 08/23/2017 15.5   . Hematocrit 08/23/2017 44.2   . MCV (Mean Corpuscular Vo* 08/23/2017 94.0   . MCH (Mean Corpuscular He* 08/23/2017 33.0*  . MCHC (Mean Corpuscular H* 08/23/2017 35.1   . Platelet Count 08/23/2017 176   . RDW-CV (Red Cell Distrib* 08/23/2017 11.9   . MPV (Mean Platelet Volum* 08/23/2017 10.1   . Neutrophils 08/23/2017 3.56   . Lymphocytes 08/23/2017 2.01   . Monocytes 08/23/2017 0.67   . Eosinophils 08/23/2017 0.25   . Basophils 08/23/2017 0.04   . Neutrophil % 08/23/2017 54.5   . Lymphocyte % 08/23/2017 30.7   . Monocyte % 08/23/2017 10.2   . Eosinophil % 08/23/2017 3.8   . Basophil% 08/23/2017 0.6   . Immature Granulocyte % 08/23/2017 0.2   . Immature Granulocyte Cou* 08/23/2017 0.01   . Glucose 08/23/2017 105   . Sodium 08/23/2017 139   . Potassium 08/23/2017 4.0   . Chloride 08/23/2017 102   . Carbon Dioxide (CO2) 08/23/2017 30.0   . Urea Nitrogen (BUN) 08/23/2017 13   . Creatinine 08/23/2017 1.0   . Glomerular Filtration Ra* 08/23/2017 75   . Calcium 08/23/2017 9.4   . AST   08/23/2017 22   . ALT  08/23/2017 28   . Alk Phos (alkaline Phosp* 08/23/2017 46   . Albumin 08/23/2017 4.6   . Bilirubin, Total 08/23/2017 0.5   . Protein, Total 08/23/2017 6.9   . A/G Ratio 08/23/2017 2.0     Abdominopelvic CT scan personally reviewed. The appendix appears normal but inside the inguinal canal. The cecum is on the abdominal cavity. There is no mention on the report about a left inguinal hernia that is appreciated on the images.   ASSESSMENT: Mr. Mcnairy is a 67 y.o. male presenting for consultation for ight inguinal hernia with appendix inside the hernia.    The patient presents with a symptomatic, reducible right inguinal hernia and asymptomatic left inguinal hernia seen on CT Scan.  Patient was oriented about the diagnosis  of bilateral inguinal hernia and its implication. The patient was oriented about the treatment alternatives (observation vs surgical repair). Due to patient symptoms, repair is recommended. Patient was oriented about the possibility of repairing the left inguinal hernia in the same surgery even though is asymptomatic. Patient agreed and understood the benefits and risk of repair both hernias. Patient oriented about the surgical procedure which in his case with the appendix in the inguinal canal will be more challenging, the use of mesh and its risk of complications such as: infection, bleeding, injury to vas deference, vasculature and testicle, injury to bowel or bladder, and chronic pain.  Again, due to the appendix herniating through the inguinal canal it was recommended to perform laparoscopic TAP approach for repair.   PLAN: 1. Laparoscopic bilateral inguinal hernia repair with mesh (09233) 2. CBC, CMP done (08/23/17) 3. Does not take any aspirin 5 days before surgery 4. Contact us if has any question or concer  Patient and his wife verbalized understanding, all questions were answered, and were agreeable with the plan outlined above.     Herbert Pun, MD  Electronically signed by Herbert Pun, MD

## 2017-08-31 ENCOUNTER — Encounter
Admission: RE | Admit: 2017-08-31 | Discharge: 2017-08-31 | Disposition: A | Discharge status: Home / Self Care | Payer: PPO | Source: Ambulatory Visit | Attending: General Surgery | Admitting: General Surgery

## 2017-08-31 ENCOUNTER — Other Ambulatory Visit: Payer: Self-pay

## 2017-08-31 NOTE — Patient Instructions (Addendum)
Your procedure is scheduled on: 09-06-17 MONDAY Report to Same Day Surgery 2nd floor medical mall [facility] Entrance-take elevator on left to 2nd floor.  Check in with surgery information desk.) To find out your arrival time please call [phone] between 1PM - 3PM on 09-03-17 FRIDAY  Remember: Instructions that are not followed completely may result in serious medical risk, up to and including death, or upon the discretion of your surgeon and anesthesiologist your surgery may need to be rescheduled.    _x___ 1. Do not eat food after midnight the night before your procedure. NO GUM OR CANDY AFTER MIDNIGHT.  You may drink clear liquids up to 2 hours before you are scheduled to arrive at the hospital for your procedure.  Do not drink clear liquids within 2 hours of your scheduled arrival to the hospital.  Clear liquids include  --Water or Apple juice without pulp  --Clear carbohydrate beverage such as ClearFast or Gatorade  --Black Coffee or Clear Tea (No milk, no creamers, do not add anything to the coffee or Tea    __x__ 2. No Alcohol for 24 hours before or after surgery.   __x__3. No Smoking or e-cigarettes for 24 prior to surgery.  Do not use any chewable tobacco products for at least 6 hour prior to surgery   ____  4. Bring all medications with you on the day of surgery if instructed.    __x__ 5. Notify your doctor if there is any change in your medical condition     (cold, fever, infections).    x___6. On the morning of surgery brush your teeth with toothpaste and water.  You may rinse your mouth with mouth wash if you wish.  Do not swallow any toothpaste or mouthwash.   Do not wear jewelry, make-up, hairpins, clips or nail polish.  Do not wear lotions, powders, or perfumes. You may wear deodorant.  Do not shave 48 hours prior to surgery. Men may shave face and neck.  Do not bring valuables to the hospital.    [facility] is not responsible for any belongings or  valuables.               Contacts, dentures or bridgework may not be worn into surgery.  Leave your suitcase in the car. After surgery it may be brought to your room.  For patients admitted to the hospital, discharge time is determined by your treatment team.  _  Patients discharged the day of surgery will not be allowed to drive home.  You will need someone to drive you home and stay with you the night of your procedure.    Please read over the following fact sheets that you were given:   [facility] Preparing for Surgery   ____ Take anti-hypertensive listed below, cardiac, seizure, asthma,anti-reflux and psychiatric medicines. These include:  1. NONE  2.  3.  4.  5.  6.  ____Fleets enema or Magnesium Citrate as directed.   _x___ Use CHG Soap or sage wipes as directed on instruction sheet   ____ Use inhalers on the day of surgery and bring to hospital day of surgery  ____ Stop Metformin and Janumet 2 days prior to surgery.    ____ Take 1/2 of usual insulin dose the night before surgery and none on the morning surgery.   _x___ Follow recommendations from Cardiologist, Pulmonologist or PCP regarding stopping Aspirin, Coumadin, Plavix ,Eliquis, Effient, or Pradaxa, and Pletal-PT TO STOP ASPIRIN ON 09-01-17   X____Stop  Anti-inflammatories such as Advil, Aleve, Ibuprofen, Motrin, Naproxen, Naprosyn, Goodies powders or aspirin products NOW-OK to take Tylenol   _x___ Stop supplements until after surgery-STOP PYCNOGENOL, UBIQUINOL, VITAMIN E, JUICE PLUS FRUIT AND VEGGIE AND MELATONIN NOW-MAY RESUME AFTER YOUR SURGERY   ____ Bring C-Pap to the hospital.

## 2017-09-01 ENCOUNTER — Encounter
Admission: RE | Admit: 2017-09-01 | Discharge: 2017-09-01 | Disposition: A | Discharge status: Home / Self Care | Payer: PPO | Source: Ambulatory Visit | Attending: General Surgery | Admitting: General Surgery

## 2017-09-01 DIAGNOSIS — Z01810 Encounter for preprocedural cardiovascular examination: Secondary | ICD-10-CM | POA: Diagnosis not present

## 2017-09-01 DIAGNOSIS — I10 Essential (primary) hypertension: Secondary | ICD-10-CM | POA: Insufficient documentation

## 2017-09-01 DIAGNOSIS — R9431 Abnormal electrocardiogram [ECG] [EKG]: Secondary | ICD-10-CM | POA: Diagnosis not present

## 2017-09-01 NOTE — Pre-Procedure Instructions (Signed)
CALLED DR ADAMS REGARDING ABNORMAL EKG.  DR ADAMS WANTING MEDICAL CLEARANCE-ALL OFFICES CLOSED AT THIS TIME.  WILL FAX CLEARANCE REQUEST AND CALL OFFICES IN AM

## 2017-09-02 NOTE — Pre-Procedure Instructions (Addendum)
Called and spoke with [facility] @ Dr Einar Crow office regarding need for medical clearance.  I faxed clearance request and EKG to Dr Peyton Najjar and to Dr Kerby Moors office. Fax confirmation received from Dr Waldemar Dickens office. Still waiting on fax to go thru to Dr Kerby Moors office.   I told Raquel Sarna their office needs to f/u with the clearance. She verbalized she would

## 2017-09-03 NOTE — Pre-Procedure Instructions (Addendum)
I had trouble on Thursday(09-02-17) getting my fax to go thru to Dr Kerby Moors office.  I faxed it 4 different times and it kept saying busy.  I called and left message for Judeen Hammans RN to see if she could walk over the clearance to [facility] internal medicine. I never heard back from [place] so I called back and spoke with receptionist who said Judeen Hammans just left but will check with Raquel Sarna to see if she can walk clearance over.  Finally my fax did go thru though to Dr Ginette Pitman.   I called over to Dr Einar Crow office this after noon (09-03-17) to f/u with clearance and their office was already closed.  I called over to Dr Kerby Moors office and spoke with April who said that she would send an urgent message back to Dr Kerby Moors nurse about seeing if pt ever got his clearance. Receptionist took my name and direct # and said she would have the nurse call me back.  NO one ever called me back and so I called over to [facility] internal med @ 1630 and the office was already closed.  I looked in Care Everywhere and saw that there was a note about pt stating that Dr Ginette Pitman was off this afternoon. I told Gregor Hams when he came and got the chart that he may want to let OR know and try and change pt from 1st case to 2nd since we dont have the clearance.

## 2017-09-06 ENCOUNTER — Encounter
Admission: RE | Disposition: A | Discharge status: Home / Self Care | Payer: Self-pay | Source: Ambulatory Visit | Attending: General Surgery

## 2017-09-06 ENCOUNTER — Other Ambulatory Visit: Payer: Self-pay

## 2017-09-06 ENCOUNTER — Encounter: Payer: Self-pay | Admitting: *Deleted

## 2017-09-06 ENCOUNTER — Ambulatory Visit
Admission: RE | Admit: 2017-09-06 | Discharge: 2017-09-06 | Disposition: A | Discharge status: Home / Self Care | Payer: PPO | Source: Ambulatory Visit | Attending: General Surgery | Admitting: General Surgery

## 2017-09-06 ENCOUNTER — Ambulatory Visit: Payer: PPO | Admitting: Anesthesiology

## 2017-09-06 DIAGNOSIS — K4020 Bilateral inguinal hernia, without obstruction or gangrene, not specified as recurrent: Secondary | ICD-10-CM | POA: Diagnosis not present

## 2017-09-06 DIAGNOSIS — K219 Gastro-esophageal reflux disease without esophagitis: Secondary | ICD-10-CM | POA: Insufficient documentation

## 2017-09-06 DIAGNOSIS — Z79899 Other long term (current) drug therapy: Secondary | ICD-10-CM | POA: Insufficient documentation

## 2017-09-06 DIAGNOSIS — D176 Benign lipomatous neoplasm of spermatic cord: Secondary | ICD-10-CM | POA: Diagnosis not present

## 2017-09-06 DIAGNOSIS — E785 Hyperlipidemia, unspecified: Secondary | ICD-10-CM | POA: Diagnosis not present

## 2017-09-06 DIAGNOSIS — R51 Headache: Secondary | ICD-10-CM | POA: Insufficient documentation

## 2017-09-06 DIAGNOSIS — G4700 Insomnia, unspecified: Secondary | ICD-10-CM | POA: Diagnosis not present

## 2017-09-06 DIAGNOSIS — Z87891 Personal history of nicotine dependence: Secondary | ICD-10-CM | POA: Insufficient documentation

## 2017-09-06 DIAGNOSIS — L409 Psoriasis, unspecified: Secondary | ICD-10-CM | POA: Insufficient documentation

## 2017-09-06 DIAGNOSIS — E559 Vitamin D deficiency, unspecified: Secondary | ICD-10-CM | POA: Diagnosis not present

## 2017-09-06 DIAGNOSIS — Z882 Allergy status to sulfonamides status: Secondary | ICD-10-CM | POA: Insufficient documentation

## 2017-09-06 DIAGNOSIS — M1990 Unspecified osteoarthritis, unspecified site: Secondary | ICD-10-CM | POA: Insufficient documentation

## 2017-09-06 DIAGNOSIS — I10 Essential (primary) hypertension: Secondary | ICD-10-CM | POA: Insufficient documentation

## 2017-09-06 DIAGNOSIS — K4090 Unilateral inguinal hernia, without obstruction or gangrene, not specified as recurrent: Secondary | ICD-10-CM | POA: Diagnosis not present

## 2017-09-06 HISTORY — PX: INGUINAL HERNIA REPAIR: SHX194

## 2017-09-06 SURGERY — REPAIR, HERNIA, INGUINAL, BILATERAL, LAPAROSCOPIC
Anesthesia: General | Laterality: Bilateral | Wound class: Clean

## 2017-09-06 MED ORDER — FENTANYL CITRATE (PF) 100 MCG/2ML IJ SOLN
INTRAMUSCULAR | Status: AC
  Filled 2017-09-06: qty 2

## 2017-09-06 MED ORDER — FENTANYL CITRATE (PF) 100 MCG/2ML IJ SOLN
INTRAMUSCULAR | Status: AC
  Administered 2017-09-06: 25 ug via INTRAVENOUS
  Filled 2017-09-06: qty 2

## 2017-09-06 MED ORDER — BUPIVACAINE-EPINEPHRINE (PF) 0.5% -1:200000 IJ SOLN
INTRAMUSCULAR | Status: AC
  Filled 2017-09-06: qty 30

## 2017-09-06 MED ORDER — LIDOCAINE HCL (CARDIAC) PF 100 MG/5ML IV SOSY
PREFILLED_SYRINGE | INTRAVENOUS | Status: DC | PRN
  Administered 2017-09-06: 100 mg via INTRAVENOUS

## 2017-09-06 MED ORDER — BUPIVACAINE LIPOSOME 1.3 % IJ SUSP
INTRAMUSCULAR | Status: DC | PRN
  Administered 2017-09-06: 21 mL

## 2017-09-06 MED ORDER — FAMOTIDINE 20 MG PO TABS
20.0000 mg | ORAL_TABLET | Freq: Once | ORAL | Status: AC
  Administered 2017-09-06: 20 mg via ORAL

## 2017-09-06 MED ORDER — MEPERIDINE HCL 50 MG/ML IJ SOLN: 6.2500 mg | INTRAMUSCULAR | Status: DC | PRN

## 2017-09-06 MED ORDER — FENTANYL CITRATE (PF) 100 MCG/2ML IJ SOLN
25.0000 ug | INTRAMUSCULAR | Status: DC | PRN
  Administered 2017-09-06 (×4): 25 ug via INTRAVENOUS

## 2017-09-06 MED ORDER — OXYCODONE HCL 5 MG/5ML PO SOLN: 5.0000 mg | Freq: Once | ORAL | Status: AC | PRN

## 2017-09-06 MED ORDER — SODIUM CHLORIDE 0.9 % IJ SOLN
INTRAMUSCULAR | Status: AC
  Filled 2017-09-06: qty 40

## 2017-09-06 MED ORDER — ROCURONIUM BROMIDE 100 MG/10ML IV SOLN
INTRAVENOUS | Status: DC | PRN
  Administered 2017-09-06 (×2): 20 mg via INTRAVENOUS
  Administered 2017-09-06 (×2): 10 mg via INTRAVENOUS
  Administered 2017-09-06: 40 mg via INTRAVENOUS
  Administered 2017-09-06: 20 mg via INTRAVENOUS

## 2017-09-06 MED ORDER — LACTATED RINGERS IV SOLN
INTRAVENOUS | Status: DC
  Administered 2017-09-06 (×2): via INTRAVENOUS

## 2017-09-06 MED ORDER — SUGAMMADEX SODIUM 200 MG/2ML IV SOLN
INTRAVENOUS | Status: AC
  Filled 2017-09-06: qty 6

## 2017-09-06 MED ORDER — PROPOFOL 10 MG/ML IV BOLUS
INTRAVENOUS | Status: DC | PRN
  Administered 2017-09-06: 170 mg via INTRAVENOUS

## 2017-09-06 MED ORDER — LIDOCAINE HCL (PF) 2 % IJ SOLN
INTRAMUSCULAR | Status: AC
  Filled 2017-09-06: qty 10

## 2017-09-06 MED ORDER — ONDANSETRON HCL 4 MG/2ML IJ SOLN
INTRAMUSCULAR | Status: AC
  Filled 2017-09-06: qty 2

## 2017-09-06 MED ORDER — ONDANSETRON HCL 4 MG/2ML IJ SOLN
INTRAMUSCULAR | Status: DC | PRN
  Administered 2017-09-06: 4 mg via INTRAVENOUS

## 2017-09-06 MED ORDER — DEXAMETHASONE SODIUM PHOSPHATE 10 MG/ML IJ SOLN
INTRAMUSCULAR | Status: DC | PRN
  Administered 2017-09-06: 5 mg via INTRAVENOUS

## 2017-09-06 MED ORDER — HYDROCODONE-ACETAMINOPHEN 5-325 MG PO TABS
1.0000 | ORAL_TABLET | ORAL | 0 refills | Status: AC | PRN
Start: 2017-09-06 — End: 2017-09-09

## 2017-09-06 MED ORDER — KETOROLAC TROMETHAMINE 30 MG/ML IJ SOLN
INTRAMUSCULAR | Status: AC
  Filled 2017-09-06: qty 1

## 2017-09-06 MED ORDER — BUPIVACAINE LIPOSOME 1.3 % IJ SUSP
INTRAMUSCULAR | Status: AC
  Filled 2017-09-06: qty 20

## 2017-09-06 MED ORDER — SUGAMMADEX SODIUM 200 MG/2ML IV SOLN
INTRAVENOUS | Status: DC | PRN
  Administered 2017-09-06: 200 mg via INTRAVENOUS

## 2017-09-06 MED ORDER — PROMETHAZINE HCL 25 MG/ML IJ SOLN: 6.2500 mg | INTRAMUSCULAR | Status: DC | PRN

## 2017-09-06 MED ORDER — CEFAZOLIN SODIUM-DEXTROSE 2-4 GM/100ML-% IV SOLN
2.0000 g | INTRAVENOUS | Status: AC
  Administered 2017-09-06: 2 g via INTRAVENOUS

## 2017-09-06 MED ORDER — CEFAZOLIN SODIUM-DEXTROSE 2-4 GM/100ML-% IV SOLN
INTRAVENOUS | Status: AC
  Filled 2017-09-06: qty 100

## 2017-09-06 MED ORDER — PROPOFOL 10 MG/ML IV BOLUS
INTRAVENOUS | Status: AC
  Filled 2017-09-06: qty 20

## 2017-09-06 MED ORDER — MIDAZOLAM HCL 2 MG/2ML IJ SOLN
INTRAMUSCULAR | Status: AC
  Filled 2017-09-06: qty 2

## 2017-09-06 MED ORDER — DEXAMETHASONE SODIUM PHOSPHATE 10 MG/ML IJ SOLN
INTRAMUSCULAR | Status: AC
  Filled 2017-09-06: qty 1

## 2017-09-06 MED ORDER — MIDAZOLAM HCL 2 MG/2ML IJ SOLN
INTRAMUSCULAR | Status: DC | PRN
  Administered 2017-09-06: 2 mg via INTRAVENOUS

## 2017-09-06 MED ORDER — OXYCODONE HCL 5 MG PO TABS
ORAL_TABLET | ORAL | Status: AC
  Filled 2017-09-06: qty 1

## 2017-09-06 MED ORDER — FENTANYL CITRATE (PF) 100 MCG/2ML IJ SOLN
INTRAMUSCULAR | Status: DC | PRN
  Administered 2017-09-06: 100 ug via INTRAVENOUS
  Administered 2017-09-06 (×2): 50 ug via INTRAVENOUS

## 2017-09-06 MED ORDER — FAMOTIDINE 20 MG PO TABS
ORAL_TABLET | ORAL | Status: AC
  Administered 2017-09-06: 20 mg via ORAL
  Filled 2017-09-06: qty 1

## 2017-09-06 MED ORDER — BUPIVACAINE-EPINEPHRINE (PF) 0.5% -1:200000 IJ SOLN
INTRAMUSCULAR | Status: DC | PRN
  Administered 2017-09-06: 20 mL via PERINEURAL

## 2017-09-06 MED ORDER — OXYCODONE HCL 5 MG PO TABS
5.0000 mg | ORAL_TABLET | Freq: Once | ORAL | Status: AC | PRN
  Administered 2017-09-06: 5 mg via ORAL

## 2017-09-06 SURGICAL SUPPLY — 49 items
APPLIER CLIP 5 13 M/L LIGAMAX5 (MISCELLANEOUS)
CANISTER SUCT 1200ML W/VALVE (MISCELLANEOUS) ×3 IMPLANT
CHLORAPREP W/TINT 26ML (MISCELLANEOUS) ×3 IMPLANT
CLIP APPLIE 5 13 M/L LIGAMAX5 (MISCELLANEOUS) IMPLANT
DEFOGGER SCOPE WARMER CLEARIFY (MISCELLANEOUS) ×3 IMPLANT
DERMABOND ADVANCED (GAUZE/BANDAGES/DRESSINGS) ×2
DERMABOND ADVANCED .7 DNX12 (GAUZE/BANDAGES/DRESSINGS) ×1 IMPLANT
DEVICE SECURE STRAP 25 ABSORB (INSTRUMENTS) IMPLANT
DISSECT BALLN SPACEMKR OVL PDB (BALLOONS) ×3
DISSECTOR BALLN SPCMKR OVL PDB (BALLOONS) ×1 IMPLANT
DISSECTOR KITTNER STICK (MISCELLANEOUS) ×2 IMPLANT
DISSECTORS/KITTNER STICK (MISCELLANEOUS) ×6
ELECT REM PT RETURN 9FT ADLT (ELECTROSURGICAL) ×3
ELECTRODE REM PT RTRN 9FT ADLT (ELECTROSURGICAL) ×1 IMPLANT
GAUZE SPONGE 4X4 12PLY STRL (GAUZE/BANDAGES/DRESSINGS) ×3 IMPLANT
GLOVE BIO SURGEON STRL SZ 6.5 (GLOVE) ×2 IMPLANT
GLOVE BIO SURGEONS STRL SZ 6.5 (GLOVE) ×1
GLOVE BIOGEL PI IND STRL 6.5 (GLOVE) ×1 IMPLANT
GLOVE BIOGEL PI INDICATOR 6.5 (GLOVE) ×2
GOWN STRL REUS W/ TWL LRG LVL3 (GOWN DISPOSABLE) ×2 IMPLANT
GOWN STRL REUS W/TWL LRG LVL3 (GOWN DISPOSABLE) ×4
IRRIGATION STRYKERFLOW (MISCELLANEOUS) ×1 IMPLANT
IRRIGATOR STRYKERFLOW (MISCELLANEOUS) ×3
IV NS 1000ML (IV SOLUTION) ×2
IV NS 1000ML BAXH (IV SOLUTION) ×1 IMPLANT
KIT TURNOVER KIT A (KITS) ×3 IMPLANT
LABEL OR SOLS (LABEL) ×3 IMPLANT
MESH 3DMAX 4X6 LT LRG (Mesh General) ×3 IMPLANT
MESH 3DMAX 4X6 RT LRG (Mesh General) ×3 IMPLANT
NEEDLE FILTER BLUNT 18X 1/2SAF (NEEDLE)
NEEDLE FILTER BLUNT 18X1 1/2 (NEEDLE) IMPLANT
NEEDLE HYPO 22GX1.5 SAFETY (NEEDLE) ×3 IMPLANT
NS IRRIG 500ML POUR BTL (IV SOLUTION) ×3 IMPLANT
PACK LAP CHOLECYSTECTOMY (MISCELLANEOUS) ×3 IMPLANT
PENCIL ELECTRO HAND CTR (MISCELLANEOUS) ×3 IMPLANT
SCISSORS METZENBAUM CVD 33 (INSTRUMENTS) ×3 IMPLANT
SURGILUBE 2OZ TUBE FLIPTOP (MISCELLANEOUS) ×3 IMPLANT
SUT ETHILON 5-0 FS-2 18 BLK (SUTURE) ×6 IMPLANT
SUT VIC AB 0 SH 27 (SUTURE) ×3 IMPLANT
SUT VIC AB 2-0 CT2 27 (SUTURE) ×3 IMPLANT
SUT VIC AB 2-0 UR6 27 (SUTURE) ×3 IMPLANT
SUT VICRYL 0 AB UR-6 (SUTURE) ×3 IMPLANT
SYR 5ML LL (SYRINGE) ×3 IMPLANT
TRAY FOLEY MTR SLVR 16FR STAT (SET/KITS/TRAYS/PACK) ×3 IMPLANT
TROCAR 5MM SINGLE VERSAONE (TROCAR) ×6 IMPLANT
TROCAR BALLN 10M OMST10SB SPAC (TROCAR) ×3 IMPLANT
TROCAR XCEL NON-BLD 11X100MML (ENDOMECHANICALS) ×3 IMPLANT
TUBING INSUFFLATION (TUBING) ×3 IMPLANT
WATER STERILE IRR 1000ML POUR (IV SOLUTION) ×3 IMPLANT

## 2017-09-06 NOTE — Anesthesia Procedure Notes (Signed)
Procedure Name: Intubation Date/Time: 09/06/2017 10:14 AM Performed by: Hedda Slade, CRNA Pre-anesthesia Checklist: Patient identified, Patient being monitored, Timeout performed, Emergency Drugs available and Suction available Patient Re-evaluated:Patient Re-evaluated prior to induction Oxygen Delivery Method: Circle system utilized Preoxygenation: Pre-oxygenation with 100% oxygen Induction Type: IV induction Ventilation: Mask ventilation without difficulty and Oral airway inserted - appropriate to patient size Laryngoscope Size: Mac and 4 Grade View: Grade II Tube type: Oral Tube size: 7.5 mm Number of attempts: 1 Airway Equipment and Method: Stylet Placement Confirmation: ETT inserted through vocal cords under direct vision,  positive ETCO2 and breath sounds checked- equal and bilateral Secured at: 22 cm Tube secured with: Tape Dental Injury: Teeth and Oropharynx as per pre-operative assessment

## 2017-09-06 NOTE — Transfer of Care (Signed)
Immediate Anesthesia Transfer of Care Note  Patient: Eduardo Patrick  Procedure(s) Performed: LAPAROSCOPIC BILATERAL INGUINAL HERNIA REPAIR (Bilateral )  Patient Location: PACU  Anesthesia Type:General  Level of Consciousness: awake, alert  and oriented  Airway & Oxygen Therapy: Patient Spontanous Breathing and Patient connected to face mask oxygen  Post-op Assessment: Report given to RN and Post -op Vital signs reviewed and stable  Post vital signs: Reviewed and stable  Last Vitals:  Vitals Value Taken Time  BP    Temp    Pulse 72 09/06/2017  2:23 PM  Resp 18 09/06/2017  2:23 PM  SpO2 100 % 09/06/2017  2:23 PM  Vitals shown include unvalidated device data.  Last Pain:  Vitals:   09/06/17 0625  TempSrc: Temporal  PainSc: 0-No pain      Patients Stated Pain Goal: 0 (16/10/96 0454)  Complications: No apparent anesthesia complications

## 2017-09-06 NOTE — Anesthesia Preprocedure Evaluation (Signed)
Anesthesia Evaluation  Patient identified by MRN, date of birth, ID band Patient awake    Reviewed: Allergy & Precautions, NPO status , Patient's Chart, lab work & pertinent test results  History of Anesthesia Complications Negative for: history of anesthetic complications  Airway Mallampati: I  TM Distance: >3 FB Neck ROM: Full    Dental no notable dental hx.    Pulmonary neg sleep apnea, neg COPD, former smoker,    breath sounds clear to auscultation- rhonchi (-) wheezing      Cardiovascular Exercise Tolerance: Good hypertension, Pt. on medications (-) CAD, (-) Past MI, (-) Cardiac Stents and (-) CABG  Rhythm:Regular Rate:Normal - Systolic murmurs and - Diastolic murmurs    Neuro/Psych  Headaches, negative psych ROS   GI/Hepatic Neg liver ROS, GERD  ,  Endo/Other  negative endocrine ROSneg diabetes  Renal/GU negative Renal ROS     Musculoskeletal  (+) Arthritis ,   Abdominal (+) - obese,   Peds  Hematology negative hematology ROS (+)   Anesthesia Other Findings Past Medical History: No date: Bronchitis No date: Cluster headache No date: Erectile dysfunction No date: Eustachian tube dysfunction No date: Fatigue No date: Genital herpes No date: GERD (gastroesophageal reflux disease) No date: Hematuria No date: Hemorrhoid No date: Hyperlipidemia No date: Hypertension No date: Insomnia No date: Knee bursitis No date: Onychomycosis No date: Psoriasis No date: Vitamin D deficiency   Reproductive/Obstetrics                             Anesthesia Physical Anesthesia Plan  ASA: II  Anesthesia Plan: General   Post-op Pain Management:    Induction: Intravenous  PONV Risk Score and Plan: 1 and Ondansetron and Midazolam  Airway Management Planned: Oral ETT  Additional Equipment:   Intra-op Plan:   Post-operative Plan: Extubation in OR  Informed Consent: I have reviewed  the patients History and Physical, chart, labs and discussed the procedure including the risks, benefits and alternatives for the proposed anesthesia with the patient or authorized representative who has indicated his/her understanding and acceptance.   Dental advisory given  Plan Discussed with: CRNA and Anesthesiologist  Anesthesia Plan Comments:         Anesthesia Quick Evaluation

## 2017-09-06 NOTE — Interval H&P Note (Signed)
History and Physical Interval Note:  09/06/2017 9:33 AM  Eduardo Patrick  has presented today for surgery, with the diagnosis of NON RECURRENT INGUINAL HERNIA  The various methods of treatment have been discussed with the patient and family. After consideration of risks, benefits and other options for treatment, the patient has consented to  Procedure(s): [place] (Bilateral) as a surgical intervention .  The patient's history has been reviewed, patient examined, no change in status, stable for surgery.  I have reviewed the patient's chart and labs.  Questions were answered to the patient's satisfaction.     Herbert Pun

## 2017-09-06 NOTE — Discharge Instructions (Signed)
°  Diet: Resume home heart healthy regular diet.   Activity: No heavy lifting >20 pounds (children, pets, laundry, garbage) or strenuous activity until follow-up, but light activity and walking are encouraged. Do not drive or drink alcohol if taking narcotic pain medications.  Wound care: May shower with soapy water and pat dry (do not rub incisions), but no baths or submerging incision underwater until follow-up. (no swimming)   Medications: Resume all home medications. Restart Aspirin on 09/08/17. For mild to moderate pain: acetaminophen (Tylenol) or ibuprofen (if no kidney disease). Combining Tylenol with alcohol can substantially increase your risk of causing liver disease. Narcotic pain medications, if prescribed, can be used for severe pain, though may cause nausea, constipation, and drowsiness. Do not combine Tylenol and Norco within a 6 hour period as Norco contains Tylenol. If you do not need the narcotic pain medication, you do not need to fill the prescription.  Call office [phone]) at any time if any questions, worsening pain, fevers/chills, bleeding, drainage from incision site, or other concerns.

## 2017-09-06 NOTE — Anesthesia Post-op Follow-up Note (Signed)
Anesthesia QCDR form completed.        

## 2017-09-06 NOTE — Op Note (Signed)
Preoperative diagnosis: Bilateral inguinal hernia.   Postoperative diagnosis: Bilateral inguinal hernia.  Procedure: Transabdominal preperitoneal laparoscopic (TAPP) repair of bilateral inguinal hernia.  Anesthesia: GETA  Surgeon: Dr. Windell Moment  Assistance: Dr. Lysle Pearl  Wound Classification: Clean  Indications:  Patient is a 67 y.o. male developed a symptomatic bilateral inguinal hernia with right hernia containing appendix in inguinal canal. Repair was indicated, and because of the nature of the hernia and symptoms. Laparoscopic repair was elected.  Findings: 1. Bilateral indirect Inguinal hernia identified 2. Vas deferens and cord structures identified and preserved 3. Bard 3D Max mesh used for repair 4. Adequate hemostasis.   Description of procedure: The patient was taken to the operating room and the correct side of surgery was verified. The patient was placed supine with arms tucked at the sides. After obtaining adequate anesthesia, the patient's abdomen was prepped and draped in standard sterile fashion. The patient was placed in the Trendelenburg position. A time-out was completed verifying correct patient, procedure, site, positioning, and implant(s) and/or special equipment prior to beginning this procedure. A Veress needle was placed at the umbilicus and pneumoperitoneum created with insufflation of carbon dioxide to 15 mmHg. After the Veress needle was removed, a 10-mm trocar was placed infraumbilically and the 30 angled laparoscope inserted. Two 5-mm trocars were then placed lateral to the rectus sheath under direct visualization. Both inguinal regions were inspected and the median umbilical ligament, medial umbilical ligament, and lateral umbilical fold were identified.  The peritoneum was incised with endoscopic scissors above the superior edge of the hernia defect, extending from the median umbilical ligament to the anterior superior iliac spine. The peritoneal flap was  mobilized inferiorly using blunt and sharp dissection. The inferior epigastric vessels were exposed and the pubic symphysis was identified. Cooper's ligament was dissected to its junction with the iliac vein. The dissection was continued inferiorly to the iliopubic tract, with care taken to avoid injury to the femoral branch of the genitofemoral nerve and the lateral femoral cutaneous nerve. The cord structures were parietalized. The hernia was identified and reduced by gentle traction.  The indirect hernia sac was noted mobilized from the cord structures and reduced into the peritoneal cavity. It was ensured that the appendix was completely reduced.   A large piece of mesh was rolled longitudinally into a compact cylinder and passed through a trocar. The cylinder was placed along the inferior aspect of the working space and unrolled into place to completely cover the direct, indirect, and femoral spaces. The mesh was secured into place superiorly to the anterior abdominal wall and inferiorly and medially to Cooper's ligament with absorbable tacks. Care was taken to avoid the inferolateral triangles containing the iliac vessels and genital nerves. The peritoneal flap was closed over the mesh and secured with tacks in similar positions of safety. After ensuring adequate hemostasis, the trocars were removed and the pneumoperitoneum allowed to escape. The trocar incisions were closed using monocryl and skin adhesive dressings applied. The same procedure was done on the left side. The only difference was that the cord lipoma of the left side was bigger and completely divided.   The patient tolerated the procedure well and was taken to the postanesthesia care unit in stable condition.   Specimen: Cord lipoma  Complications: None  Estimated Blood Loss: 10 mL

## 2017-09-07 NOTE — Anesthesia Postprocedure Evaluation (Signed)
Anesthesia Post Note  Patient: Eduardo Patrick  Procedure(s) Performed: LAPAROSCOPIC BILATERAL INGUINAL HERNIA REPAIR (Bilateral )  Patient location during evaluation: PACU Anesthesia Type: General Level of consciousness: awake and alert and oriented Pain management: pain level controlled Vital Signs Assessment: post-procedure vital signs reviewed and stable Respiratory status: spontaneous breathing, nonlabored ventilation and respiratory function stable Cardiovascular status: blood pressure returned to baseline and stable Postop Assessment: no signs of nausea or vomiting Anesthetic complications: no     Last Vitals:  Vitals:   09/06/17 1528 09/06/17 1606  BP: (!) 158/91 (!) 158/88  Pulse:  65  Resp:    Temp:    SpO2:  96%    Last Pain:  Vitals:   09/06/17 1606  TempSrc:   PainSc: 4                  Caius Silbernagel

## 2017-09-09 LAB — SURGICAL PATHOLOGY

## 2017-11-24 DIAGNOSIS — Z9889 Other specified postprocedural states: Secondary | ICD-10-CM | POA: Diagnosis not present

## 2017-11-24 DIAGNOSIS — R7989 Other specified abnormal findings of blood chemistry: Secondary | ICD-10-CM | POA: Diagnosis not present

## 2017-11-24 DIAGNOSIS — Z23 Encounter for immunization: Secondary | ICD-10-CM | POA: Diagnosis not present

## 2017-11-24 DIAGNOSIS — I10 Essential (primary) hypertension: Secondary | ICD-10-CM | POA: Diagnosis not present

## 2017-11-24 DIAGNOSIS — E785 Hyperlipidemia, unspecified: Secondary | ICD-10-CM | POA: Diagnosis not present

## 2017-11-24 DIAGNOSIS — Z8719 Personal history of other diseases of the digestive system: Secondary | ICD-10-CM | POA: Diagnosis not present

## 2017-12-08 DIAGNOSIS — Z0184 Encounter for antibody response examination: Secondary | ICD-10-CM | POA: Diagnosis not present

## 2017-12-08 DIAGNOSIS — Z0183 Encounter for blood typing: Secondary | ICD-10-CM | POA: Diagnosis not present

## 2017-12-27 DIAGNOSIS — L02411 Cutaneous abscess of right axilla: Secondary | ICD-10-CM | POA: Diagnosis not present

## 2018-07-25 DIAGNOSIS — R7989 Other specified abnormal findings of blood chemistry: Secondary | ICD-10-CM | POA: Diagnosis not present

## 2018-07-25 DIAGNOSIS — E785 Hyperlipidemia, unspecified: Secondary | ICD-10-CM | POA: Diagnosis not present

## 2018-07-25 DIAGNOSIS — I10 Essential (primary) hypertension: Secondary | ICD-10-CM | POA: Diagnosis not present

## 2018-07-25 DIAGNOSIS — Z125 Encounter for screening for malignant neoplasm of prostate: Secondary | ICD-10-CM | POA: Diagnosis not present

## 2018-07-25 DIAGNOSIS — Z9889 Other specified postprocedural states: Secondary | ICD-10-CM | POA: Diagnosis not present

## 2018-07-25 DIAGNOSIS — Z8719 Personal history of other diseases of the digestive system: Secondary | ICD-10-CM | POA: Diagnosis not present

## 2018-08-04 DIAGNOSIS — I10 Essential (primary) hypertension: Secondary | ICD-10-CM | POA: Diagnosis not present

## 2018-08-04 DIAGNOSIS — R7989 Other specified abnormal findings of blood chemistry: Secondary | ICD-10-CM | POA: Diagnosis not present

## 2018-08-04 DIAGNOSIS — Z0000 Encounter for general adult medical examination without abnormal findings: Secondary | ICD-10-CM | POA: Diagnosis not present

## 2018-08-04 DIAGNOSIS — E782 Mixed hyperlipidemia: Secondary | ICD-10-CM | POA: Diagnosis not present

## 2019-02-08 DIAGNOSIS — L02411 Cutaneous abscess of right axilla: Secondary | ICD-10-CM | POA: Diagnosis not present

## 2019-05-26 DIAGNOSIS — I10 Essential (primary) hypertension: Secondary | ICD-10-CM | POA: Diagnosis not present

## 2019-05-26 DIAGNOSIS — R7989 Other specified abnormal findings of blood chemistry: Secondary | ICD-10-CM | POA: Diagnosis not present

## 2019-05-26 DIAGNOSIS — N528 Other male erectile dysfunction: Secondary | ICD-10-CM | POA: Diagnosis not present

## 2019-05-26 DIAGNOSIS — Z0000 Encounter for general adult medical examination without abnormal findings: Secondary | ICD-10-CM | POA: Diagnosis not present

## 2019-07-18 DIAGNOSIS — R42 Dizziness and giddiness: Secondary | ICD-10-CM | POA: Diagnosis not present

## 2019-07-18 DIAGNOSIS — I10 Essential (primary) hypertension: Secondary | ICD-10-CM | POA: Diagnosis not present

## 2019-07-24 IMAGING — CT CT ABD-PELV W/ CM
2 of 5 series · 15 of 46 positions shown, 17 images · IV contrast (APPLIED)
Comparison: None.

CLINICAL DATA: 66-year-old male right lower quadrant pain for the
past month. Prior umbilical hernia repair. Initial encounter.

EXAM:
CT ABDOMEN AND PELVIS WITH CONTRAST
TECHNIQUE: Multidetector CT imaging of the abdomen and pelvis was performed
using the standard protocol following bolus administration of
intravenous contrast.
CONTRAST:  100mL OMNIPAQUE IOHEXOL 300 MG/ML  SOLN

[Series 2: routine abd/pel with · axial · 0.73mm/px · z∈[-1268,-833]mm · 12 of 99 slices shown, 14 images]
[im 6/99  soft-tissue]
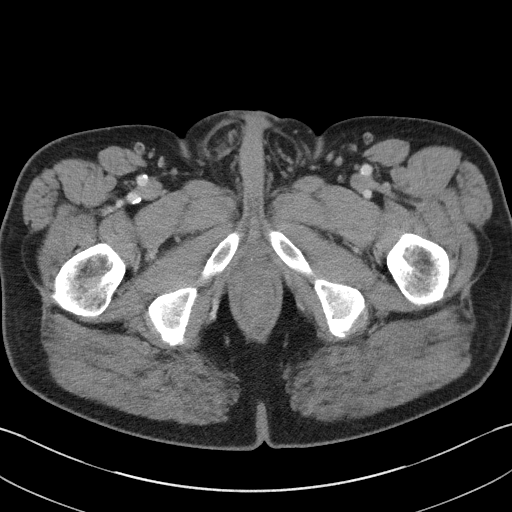
[im 6/99  bone]
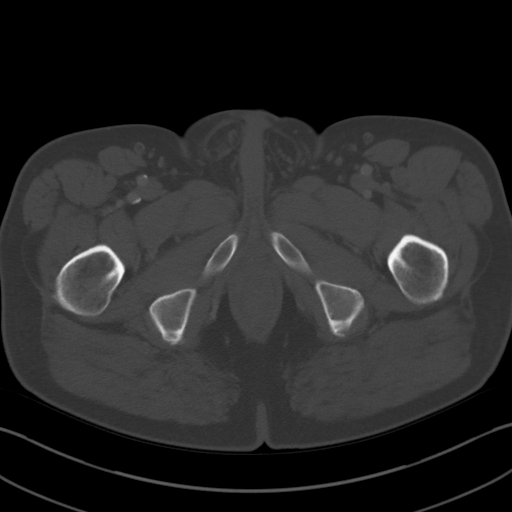
[im 16/99  soft-tissue]
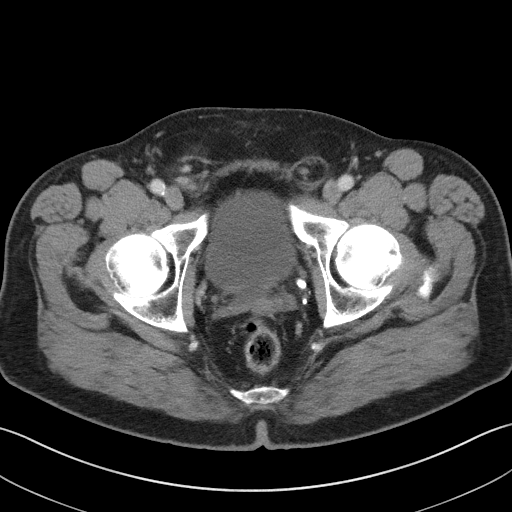
[im 21/99  soft-tissue]
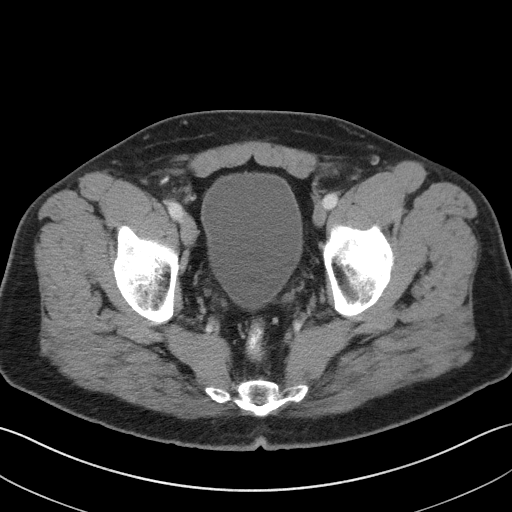
[im 31/99  soft-tissue]
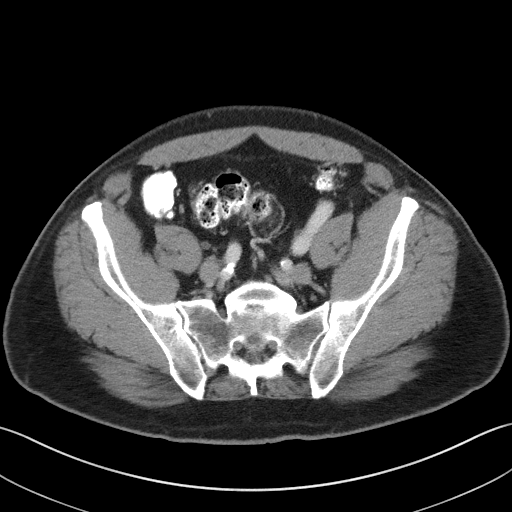
[im 37/99  soft-tissue]
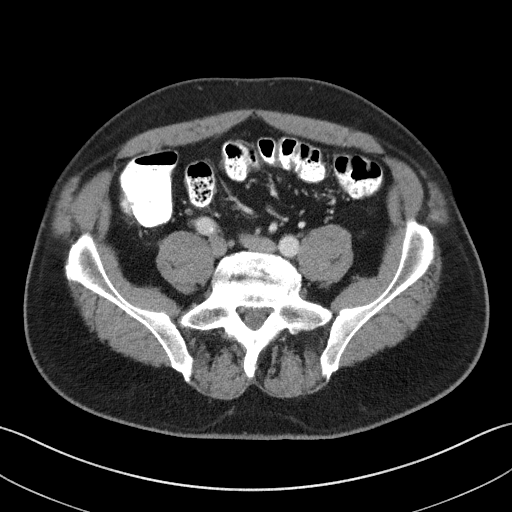
[im 47/99  soft-tissue]
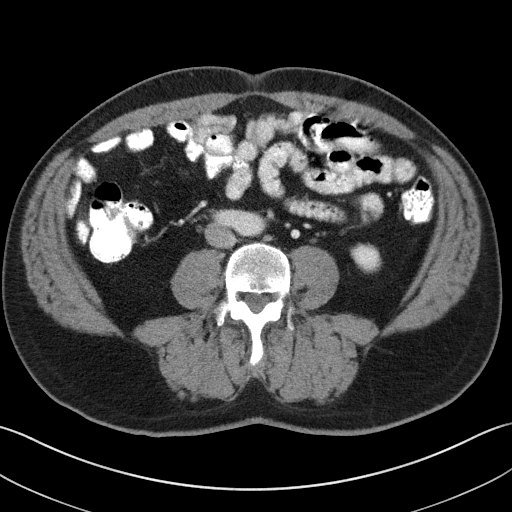
[im 52/99  soft-tissue]
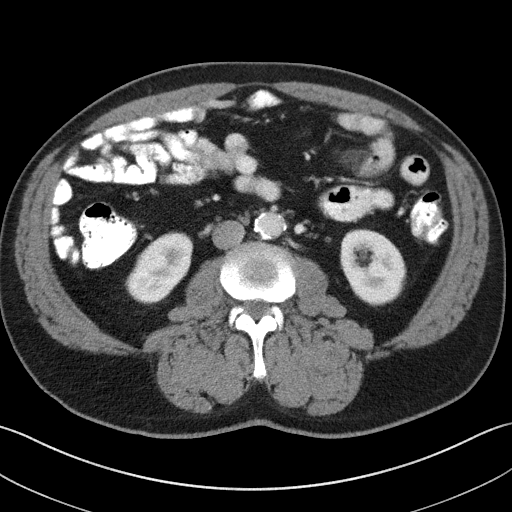
[im 62/99  soft-tissue]
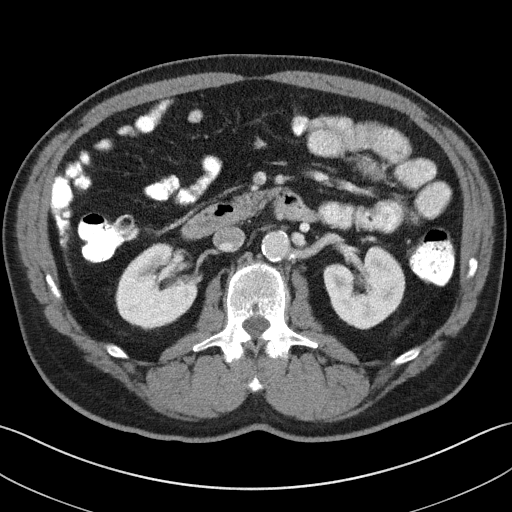
[im 68/99  soft-tissue]
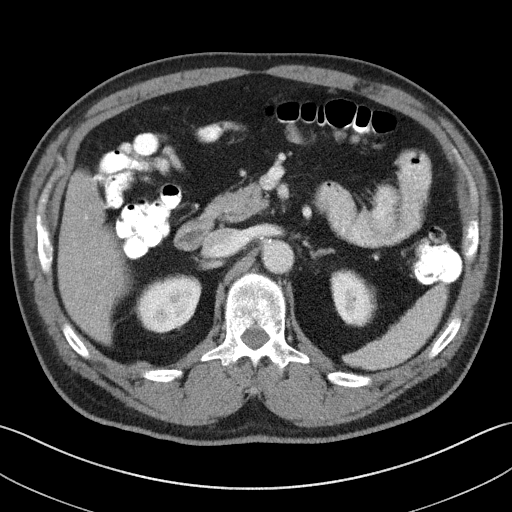
[im 68/99  bone]
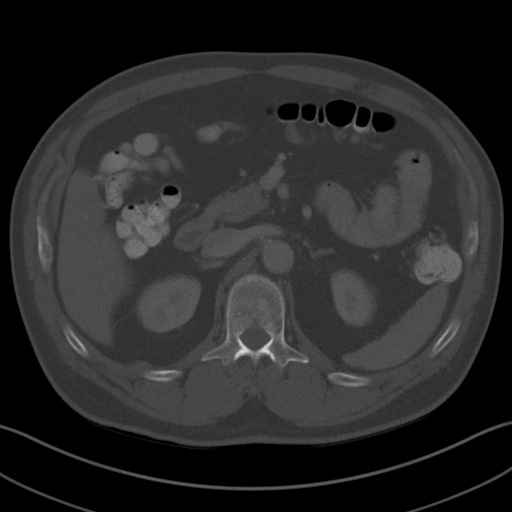
[im 78/99  soft-tissue]
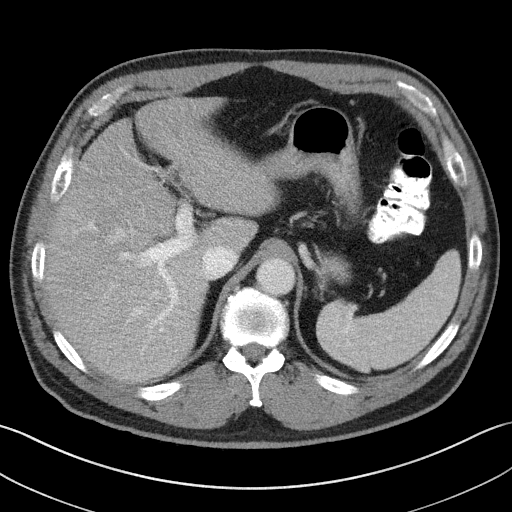
[im 83/99  soft-tissue]
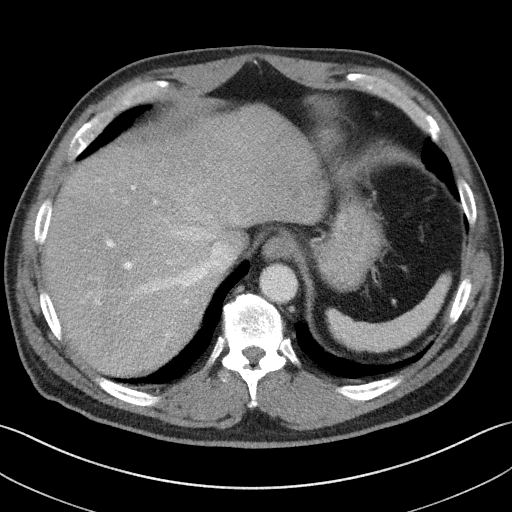
[im 93/99  soft-tissue]
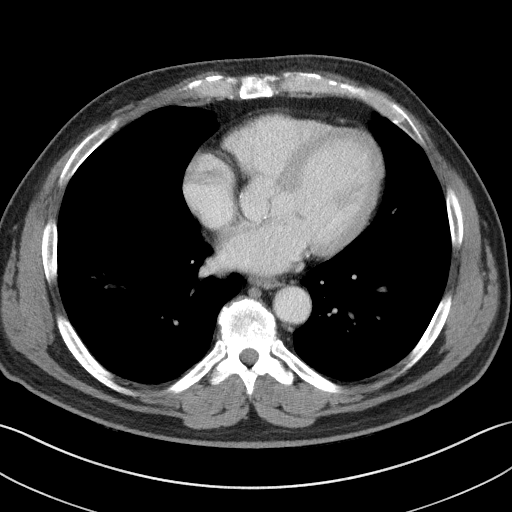

[Series 5: coronal st · coronal · 0.81mm/px · 3 of 100 slices shown]
[im 34/100  soft-tissue]
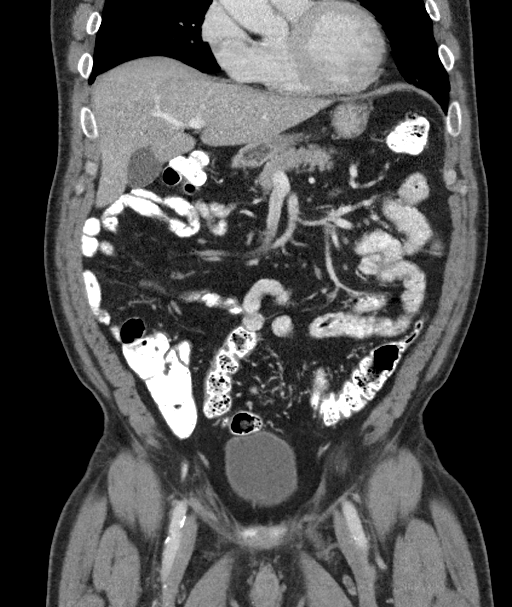
[im 45/100  soft-tissue]
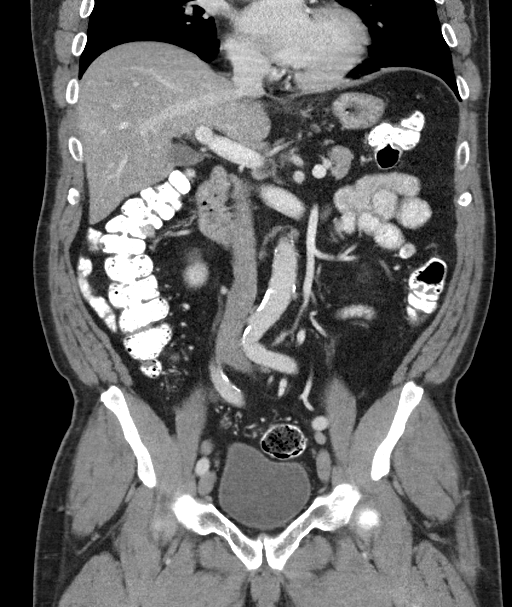
[im 56/100  soft-tissue]
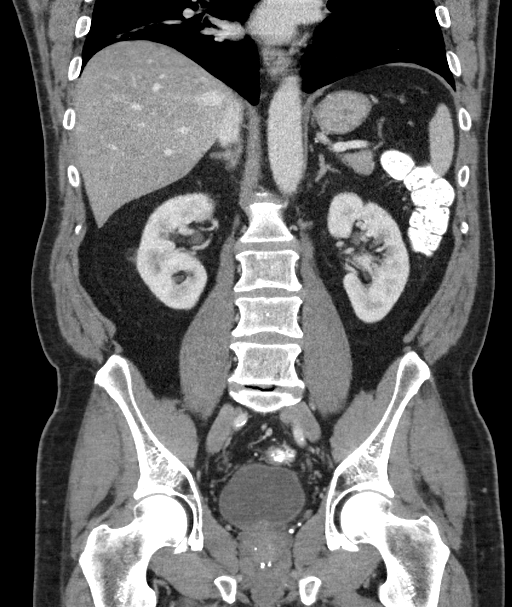

[15 of 46 positions shown; findings below may reference images not displayed]

FINDINGS: Lower chest: 2 calcified granuloma right middle lobe. Scattered
minimal atelectasis/scarring lung bases. Heart size top-normal.
Coronary artery calcification.

Hepatobiliary: Fatty liver. No worrisome hepatic lesion. No
calcified gallstones or common bile duct stone.

Pancreas: No pancreatic mass or inflammation

Spleen: No splenic mass or enlargement.

Adrenals/Urinary Tract: No adrenal mass.

No obstructing stone or hydronephrosis. Tiny nonobstructing right
lower pole renal calculus and 3 mm nonobstructing left lower pole
renal calculus. Left lower pole 1.8 cm cyst. Left upper pole tiny
low-density structure too small to characterize possibly a tiny
angiomyolipoma.

Noncontrast filled views of the urinary bladder unremarkable.

Stomach/Bowel: The appendix extends into the right inguinal canal.
Currently appendix does not appear inflamed. If appendix did become
inflamed, the patient may have an atypical presentation for
appendicitis.

Overall, no extraluminal bowel inflammatory process is noted.

Stomach under distended and evaluation slightly limited. No gross
abnormality detected.

Vascular/Lymphatic: Atherosclerotic changes abdominal aorta without
aneurysm. Atherosclerotic changes iliac arteries and femoral artery.
No large vessel occlusion.

Scattered small normal size lymph nodes without adenopathy.

Reproductive: Coarse prostate gland calcifications. Minimally
lobulated contour of prostate gland with minimal impression upon the
bladder base.

Other: No free air.

In addition to appendix in right inguinal canal, fat and vessels are
seen extending into the inguinal canals.

Musculoskeletal: Transitional S1 vertebra. Moderate L5-S1 disc space
narrowing. Scattered Schmorl's node deformities most notable
superior endplate T12.
IMPRESSION: The appendix extends into the right inguinal canal. Currently
appendix does not appear inflamed. If appendix did become inflamed,
the patient may have an atypical presentation for appendicitis.

Overall, no extraluminal bowel inflammatory process is noted.

Bilateral nonobstructing renal calculi. Left lower pole 1.8 cm cyst.
Possible left upper pole tiny angiomyolipoma.

Fatty liver.

Coronary artery calcification.

Aortic Atherosclerosis ([id number]).

Degenerative changes lower thoracic and lumbar spine.

Minimally lobulated prostate gland with minimal impression upon the
bladder base.

## 2019-12-04 DIAGNOSIS — I10 Essential (primary) hypertension: Secondary | ICD-10-CM | POA: Diagnosis not present

## 2019-12-04 DIAGNOSIS — S46002A Unspecified injury of muscle(s) and tendon(s) of the rotator cuff of left shoulder, initial encounter: Secondary | ICD-10-CM | POA: Diagnosis not present

## 2019-12-04 DIAGNOSIS — W010XXA Fall on same level from slipping, tripping and stumbling without subsequent striking against object, initial encounter: Secondary | ICD-10-CM | POA: Diagnosis not present

## 2020-01-25 DIAGNOSIS — R7989 Other specified abnormal findings of blood chemistry: Secondary | ICD-10-CM | POA: Diagnosis not present

## 2020-01-25 DIAGNOSIS — M5416 Radiculopathy, lumbar region: Secondary | ICD-10-CM | POA: Diagnosis not present

## 2020-01-25 DIAGNOSIS — M5116 Intervertebral disc disorders with radiculopathy, lumbar region: Secondary | ICD-10-CM | POA: Diagnosis not present

## 2020-01-25 DIAGNOSIS — I700 Atherosclerosis of aorta: Secondary | ICD-10-CM | POA: Diagnosis not present

## 2020-01-25 DIAGNOSIS — M4726 Other spondylosis with radiculopathy, lumbar region: Secondary | ICD-10-CM | POA: Diagnosis not present

## 2020-01-25 DIAGNOSIS — I10 Essential (primary) hypertension: Secondary | ICD-10-CM | POA: Diagnosis not present

## 2020-01-26 DIAGNOSIS — R8290 Unspecified abnormal findings in urine: Secondary | ICD-10-CM | POA: Diagnosis not present

## 2021-06-19 ENCOUNTER — Other Ambulatory Visit: Payer: Self-pay | Admitting: General Surgery

## 2021-06-19 DIAGNOSIS — R1031 Right lower quadrant pain: Secondary | ICD-10-CM

## 2021-06-27 ENCOUNTER — Ambulatory Visit
Admission: RE | Admit: 2021-06-27 | Discharge: 2021-06-27 | Disposition: A | Discharge status: Home / Self Care | Payer: Medicare Other | Source: Ambulatory Visit | Attending: General Surgery | Admitting: General Surgery

## 2021-06-27 DIAGNOSIS — R1031 Right lower quadrant pain: Secondary | ICD-10-CM | POA: Insufficient documentation

## 2021-06-27 DIAGNOSIS — R1032 Left lower quadrant pain: Secondary | ICD-10-CM | POA: Diagnosis present

## 2021-12-27 ENCOUNTER — Ambulatory Visit
Admission: EM | Admit: 2021-12-27 | Discharge: 2021-12-27 | Disposition: A | Discharge status: Home / Self Care | Payer: Medicare Other | Attending: Physician Assistant | Admitting: Physician Assistant

## 2021-12-27 ENCOUNTER — Ambulatory Visit (INDEPENDENT_AMBULATORY_CARE_PROVIDER_SITE_OTHER): Payer: Medicare Other

## 2021-12-27 DIAGNOSIS — J40 Bronchitis, not specified as acute or chronic: Secondary | ICD-10-CM | POA: Diagnosis not present

## 2021-12-27 DIAGNOSIS — Z8616 Personal history of COVID-19: Secondary | ICD-10-CM | POA: Insufficient documentation

## 2021-12-27 DIAGNOSIS — Z1152 Encounter for screening for COVID-19: Secondary | ICD-10-CM | POA: Insufficient documentation

## 2021-12-27 DIAGNOSIS — R059 Cough, unspecified: Secondary | ICD-10-CM | POA: Diagnosis not present

## 2021-12-27 DIAGNOSIS — I10 Essential (primary) hypertension: Secondary | ICD-10-CM | POA: Diagnosis not present

## 2021-12-27 DIAGNOSIS — E785 Hyperlipidemia, unspecified: Secondary | ICD-10-CM | POA: Diagnosis not present

## 2021-12-27 DIAGNOSIS — R051 Acute cough: Secondary | ICD-10-CM | POA: Diagnosis present

## 2021-12-27 DIAGNOSIS — J019 Acute sinusitis, unspecified: Secondary | ICD-10-CM | POA: Diagnosis not present

## 2021-12-27 DIAGNOSIS — R0989 Other specified symptoms and signs involving the circulatory and respiratory systems: Secondary | ICD-10-CM

## 2021-12-27 LAB — RESP PANEL BY RT-PCR (FLU A&B, COVID) ARPGX2
Influenza A by PCR: NEGATIVE
Influenza B by PCR: NEGATIVE
SARS Coronavirus 2 by RT PCR: NEGATIVE

## 2021-12-27 MED ORDER — CHERATUSSIN AC 100-10 MG/5ML PO SOLN: 10.0000 mL | Freq: Three times a day (TID) | ORAL | 0 refills | Status: DC | PRN

## 2021-12-27 MED ORDER — PREDNISONE 20 MG PO TABS: 40.0000 mg | ORAL_TABLET | Freq: Every day | ORAL | 0 refills | Status: AC

## 2021-12-27 MED ORDER — DOXYCYCLINE HYCLATE 100 MG PO CAPS: 100.0000 mg | ORAL_CAPSULE | Freq: Two times a day (BID) | ORAL | 0 refills | Status: AC

## 2021-12-27 NOTE — ED Provider Notes (Signed)
[facility]    CSN: [id number] Arrival date & time: 12/27/21  0813      History   Chief Complaint Chief Complaint  Patient presents with   Cough   Headache    HPI Eduardo Patrick is a 71 y.o. male presenting with his wife-she is sick with similar symptoms- for productive cough, nasal congestion, chest congestion, wheezing, headaches and pain in ribs due to coughing for a few weeks.  Reports symptoms resolved for 2 to 2-1/2 weeks and then he went to [place] on vacation to celebrate his anniversary.  He reports that he came back from there about 2 to 3 days ago and that is when his symptoms started over again.  He is reporting fatigue, cough and congestion.  No associated fevers to his knowledge.  Reporting some sinus pressure.  No report of sore throat.  Some shortness of breath associated with the wheezing.  No report of abdominal pain, vomiting or diarrhea.  Has been taking OTC meds without much relief.  Self-reported history of COVID-19 4 times.  No vaccinations for COVID.  Has medical history significant for hypertension, hyperlipidemia, psoriasis.  HPI  Past Medical History:  Diagnosis Date   Bronchitis    Cluster headache    Erectile dysfunction    Eustachian tube dysfunction    Fatigue    Genital herpes    GERD (gastroesophageal reflux disease)    Hematuria    Hemorrhoid    Hyperlipidemia    Hypertension    Insomnia    Knee bursitis    Onychomycosis    Psoriasis    Vitamin D deficiency     Patient Active Problem List   Diagnosis Date Noted   Hypertension 07/19/2014   GERD (gastroesophageal reflux disease) 07/19/2014   Erectile dysfunction 07/19/2014   Insomnia 07/19/2014    Past Surgical History:  Procedure Laterality Date   INGUINAL HERNIA REPAIR Bilateral 09/06/2017   Procedure: LAPAROSCOPIC BILATERAL INGUINAL HERNIA REPAIR;  Surgeon: Herbert Pun, MD;  Location: [facility] ORS;  Service: General;  Laterality: Bilateral;    SHOULDER SURGERY     X3   UMBILICAL HERNIA REPAIR         Home Medications    Prior to Admission medications   Medication Sig Start Date End Date Taking? Authorizing Provider  doxycycline (VIBRAMYCIN) 100 MG capsule Take 1 capsule (100 mg total) by mouth 2 (two) times daily for 7 days. 12/27/21 01/03/22 Yes Danton Clap, PA-C  guaiFENesin-codeine (CHERATUSSIN AC) 100-10 MG/5ML syrup Take 10 mLs by mouth 3 (three) times daily as needed for cough. 12/27/21  Yes Danton Clap, PA-C  predniSONE (DELTASONE) 20 MG tablet Take 2 tablets (40 mg total) by mouth daily for 5 days. 12/27/21 01/01/22 Yes Danton Clap, PA-C  acyclovir (ZOVIRAX) 200 MG capsule Take 1 capsule (200 mg total) by mouth 2 (two) times daily. Patient taking differently: Take 200 mg by mouth daily.  02/28/15   Jerrol Banana., MD  aspirin EC 81 MG tablet Take 81 mg by mouth daily.    [provider]  Cholecalciferol (VITAMIN D PO) Take 5,000 Units by mouth daily.     [provider]  doxylamine, Sleep, (UNISOM) 25 MG tablet Take 25 mg by mouth at bedtime.    [provider]  hydrochlorothiazide (MICROZIDE) 12.5 MG capsule Take 12.5 mg by mouth daily.    [provider]  Melatonin 5 MG TABS Take 5 mg by mouth at bedtime.  [provider]  Multiple Vitamin (MULTIVITAMIN) capsule Take 1 capsule by mouth daily.    [provider]  Nutritional Supplements (PYCNOGENOL PO) Take 50 mg by mouth daily.    [provider]  OVER THE COUNTER MEDICATION Take 1 capsule by mouth daily. Juice Plus Fruit    [provider]  OVER THE COUNTER MEDICATION Take 1 capsule by mouth daily. Juice plus Veggie    [provider]  rosuvastatin (CRESTOR) 20 MG tablet Take 20 mg by mouth every morning.    [provider]  Ubiquinol 100 MG CAPS Take 1 capsule by mouth daily.    [provider]  vitamin E (VITAMIN E) 400 UNIT capsule Take 800 Units  by mouth daily.    [provider]    Family History Family History  Problem Relation Age of Onset   Hyperlipidemia Mother    Glaucoma Mother    Pulmonary fibrosis Mother    Heart attack Father    Other Brother        Enlarged prostate    Social History Social History   Tobacco Use   Smoking status: Former    Packs/day: 1.00    Years: 18.00    Total pack years: 18.00    Types: Cigarettes   Smokeless tobacco: Never   Tobacco comments:    Pt quit 40 years ago  Vaping Use   Vaping Use: Never used  Substance Use Topics   Alcohol use: Yes   Drug use: No     Allergies   Sulfa antibiotics   Review of Systems Review of Systems  Constitutional:  Positive for fatigue. Negative for fever.  HENT:  Positive for congestion, rhinorrhea and sinus pressure. Negative for sinus pain and sore throat.   Respiratory:  Positive for cough, shortness of breath and wheezing.   Cardiovascular:  Positive for chest pain (rib pain).  Gastrointestinal:  Negative for abdominal pain, diarrhea, nausea and vomiting.  Musculoskeletal:  Negative for myalgias.  Neurological:  Positive for headaches. Negative for weakness and light-headedness.  Hematological:  Negative for adenopathy.     Physical Exam Triage Vital Signs ED Triage Vitals  Enc Vitals Group     BP      Pulse      Resp      Temp      Temp src      SpO2      Weight      Height      Head Circumference      Peak Flow      Pain Score      Pain Loc      Pain Edu?      Excl. in [place]?    No data found.  Updated Vital Signs BP (!) 139/101 (BP Location: Left Arm)   Pulse 88   Temp 98.8 F (37.1 C) (Oral)   Resp 18   Ht '5\' 11"'$  (1.803 m)   Wt 204 lb (92.5 kg)   SpO2 96%   BMI 28.45 kg/m      Physical Exam Vitals and nursing note reviewed.  Constitutional:      General: He is not in acute distress.    Appearance: Normal appearance. He is well-developed. He is ill-appearing.  HENT:     Head: Normocephalic  and atraumatic.     Right Ear: Tympanic membrane, ear canal and external ear normal.     Left Ear: Tympanic membrane, ear canal and external ear normal.  Nose: Congestion present.     Mouth/Throat:     Mouth: Mucous membranes are moist.     Pharynx: Oropharynx is clear. No posterior oropharyngeal erythema.  Eyes:     General: No scleral icterus.    Conjunctiva/sclera: Conjunctivae normal.  Cardiovascular:     Rate and Rhythm: Normal rate and regular rhythm.     Heart sounds: Normal heart sounds.  Pulmonary:     Effort: Pulmonary effort is normal. No respiratory distress.     Breath sounds: Normal breath sounds. No wheezing or rhonchi.  Musculoskeletal:     Cervical back: Neck supple.  Skin:    General: Skin is warm and dry.     Capillary Refill: Capillary refill takes less than 2 seconds.  Neurological:     General: No focal deficit present.     Mental Status: He is alert. Mental status is at baseline.     Motor: No weakness.     Coordination: Coordination normal.     Gait: Gait normal.  Psychiatric:        Mood and Affect: Mood normal.        Behavior: Behavior normal.      UC Treatments / Results  Labs (all labs ordered are listed, but only abnormal results are displayed) Labs Reviewed  RESP PANEL BY RT-PCR (FLU A&B, COVID) [id number]    EKG   Radiology DG Chest 2 View  Result Date: 12/27/2021 CLINICAL DATA:  Cough and congestion for 2.5 months EXAM: CHEST - 2 VIEW COMPARISON:  None Available. FINDINGS: The heart size and mediastinal contours are within normal limits. No focal pulmonary opacity. No pleural effusion or pneumothorax. The visualized upper abdomen is unremarkable. No acute osseous abnormality. IMPRESSION: No acute cardiopulmonary abnormality. Electronically Signed   By: Beryle Flock M.D.   On: 12/27/2021 09:03    Procedures Procedures (including critical care time)  Medications Ordered in UC Medications - No data to display  Initial Impression  / Assessment and Plan / UC Course  I have reviewed the triage vital signs and the nursing notes.  Pertinent labs & imaging results that were available during my care of the patient were reviewed by me and considered in my medical decision making (see chart for details).   71 year old male presenting for productive cough, congestion, wheezing and fatigue.  Symptoms started about 2 to 3 days ago while on vacation.  Reports similar symptoms that lasted for several weeks about 2 weeks ago.  No known fever.  BP elevated at 139/101.  He is afebrile.  He is in no acute distress.  On exam he has nasal congestion.  His throat is clear.  Chest is clear auscultation heart regular rate rhythm.  Chest x-ray ordered.  X-ray does not show any acute abnormalities.  Discussed results with patient.  Will obtain respiratory panel to assess for flu and COVID-19.  Since his symptoms discharged back a couple days ago on vacation, he could have picked up 1 of these viruses there.  He is agreeable to this.  Advised him that we may treat with antiviral medication depending on the result.  If results are negative, I can treated with antibiotics for secondary sickening.  Respiratory panel is all negative.  Call discussed results with patient.  We will treat him for possible secondary bacterial infection given his history of illness.  Sent doxycycline to pharmacy as well as prednisone and Cheratussin after reviewing controlled symptoms database.  Reviewed supportive care.  Reviewed return precautions.  Final Clinical Impressions(s) / UC Diagnoses   Final diagnoses:  Bronchitis  Acute cough  Acute sinusitis, recurrence not specified, unspecified location     Discharge Instructions      -Your x-ray does not show pneumonia.  You may have bronchitis but I am also checking for COVID and flu.  Those results will be back today and we will call you with the results. -We will call if your flu or COVID test are positive.  If  positive for COVID you will need to isolate 5 days from symptom onset and then wear mask for 5 days.   -If positive for the flu you will need to be out for a couple of days until you are fever free greater than 24 hours before returning to work.   - Supportive care encouraged with increasing rest and fluids, Mucinex or the cough medication I sent to the pharmacy, Flonase, nasal saline, Zicam, Tylenol or Motrin as needed for fever control. -We may consider antibiotics if your COVID/flu test is negative.     ED Prescriptions     Medication Sig Dispense Auth. Provider   guaiFENesin-codeine (CHERATUSSIN AC) 100-10 MG/5ML syrup Take 10 mLs by mouth 3 (three) times daily as needed for cough. 120 mL Laurene Footman B, PA-C   doxycycline (VIBRAMYCIN) 100 MG capsule Take 1 capsule (100 mg total) by mouth 2 (two) times daily for 7 days. 14 capsule Laurene Footman B, PA-C   predniSONE (DELTASONE) 20 MG tablet Take 2 tablets (40 mg total) by mouth daily for 5 days. 10 tablet Danton Clap, PA-C      I have reviewed the PDMP during this encounter.   Danton Clap, PA-C 12/27/21 1027

## 2021-12-27 NOTE — Discharge Instructions (Signed)
-  Your x-ray does not show pneumonia.  You may have bronchitis but I am also checking for COVID and flu.  Those results will be back today and we will call you with the results. -We will call if your flu or COVID test are positive.  If positive for COVID you will need to isolate 5 days from symptom onset and then wear mask for 5 days.   -If positive for the flu you will need to be out for a couple of days until you are fever free greater than 24 hours before returning to work.   - Supportive care encouraged with increasing rest and fluids, Mucinex or the cough medication I sent to the pharmacy, Flonase, nasal saline, Zicam, Tylenol or Motrin as needed for fever control. -We may consider antibiotics if your COVID/flu test is negative.

## 2021-12-27 NOTE — ED Triage Notes (Signed)
Pt c/o cough, nasal and chest congestion, wheezing, headache rib pain from coughing x8weeks

## 2022-01-04 ENCOUNTER — Ambulatory Visit
Admission: EM | Admit: 2022-01-04 | Discharge: 2022-01-04 | Disposition: A | Discharge status: Home / Self Care | Payer: Medicare Other | Attending: Family Medicine | Admitting: Family Medicine

## 2022-01-04 DIAGNOSIS — J189 Pneumonia, unspecified organism: Secondary | ICD-10-CM

## 2022-01-04 MED ORDER — AMLODIPINE BESYLATE 2.5 MG PO TABS: 2.5000 mg | ORAL_TABLET | Freq: Every day | ORAL | 0 refills | Status: AC

## 2022-01-04 MED ORDER — CEFDINIR 300 MG PO CAPS: 300.0000 mg | ORAL_CAPSULE | Freq: Two times a day (BID) | ORAL | 0 refills | Status: AC

## 2022-01-04 MED ORDER — AZITHROMYCIN 250 MG PO TABS: 250.0000 mg | ORAL_TABLET | Freq: Every day | ORAL | 0 refills | Status: AC

## 2022-01-04 MED ORDER — BENZONATATE 100 MG PO CAPS: 100.0000 mg | ORAL_CAPSULE | Freq: Three times a day (TID) | ORAL | 0 refills | Status: AC

## 2022-01-04 MED ORDER — PREDNISONE 10 MG (21) PO TBPK: ORAL_TABLET | Freq: Every day | ORAL | 0 refills | Status: AC

## 2022-01-04 MED ORDER — CHERATUSSIN AC 100-10 MG/5ML PO SOLN: 10.0000 mL | Freq: Three times a day (TID) | ORAL | 0 refills | Status: AC | PRN

## 2022-01-04 NOTE — ED Provider Notes (Signed)
[facility]    CSN: [id number] Arrival date & time: 01/04/22  0810      History   Chief Complaint Chief Complaint  Patient presents with   Nasal Congestion   chest congestion    HPI Eduardo Patrick is a 71 y.o. male.   HPI   Eduardo Patrick presents for nasal congestion and chest congestion. He was seen for similar about 1-2 weeks ago.  He feels like his chest is getting worse. He has wheezing. He is former smoker. Thinks he smoked about a pack a day for several years. ***       Past Medical History:  Diagnosis Date   Bronchitis    Cluster headache    Erectile dysfunction    Eustachian tube dysfunction    Fatigue    Genital herpes    GERD (gastroesophageal reflux disease)    Hematuria    Hemorrhoid    Hyperlipidemia    Hypertension    Insomnia    Knee bursitis    Onychomycosis    Psoriasis    Vitamin D deficiency     Patient Active Problem List   Diagnosis Date Noted   Hypertension 07/19/2014   GERD (gastroesophageal reflux disease) 07/19/2014   Erectile dysfunction 07/19/2014   Insomnia 07/19/2014    Past Surgical History:  Procedure Laterality Date   INGUINAL HERNIA REPAIR Bilateral 09/06/2017   Procedure: LAPAROSCOPIC BILATERAL INGUINAL HERNIA REPAIR;  Surgeon: Herbert Pun, MD;  Location: [facility] ORS;  Service: General;  Laterality: Bilateral;   SHOULDER SURGERY     X3   UMBILICAL HERNIA REPAIR         Home Medications    Prior to Admission medications   Medication Sig Start Date End Date Taking? Authorizing Provider  amLODipine (NORVASC) 2.5 MG tablet Take 1 tablet (2.5 mg total) by mouth daily. 01/04/22  Yes Tuere Nwosu, DO  azithromycin (ZITHROMAX Z-PAK) 250 MG tablet Take 1 tablet (250 mg total) by mouth daily. Take 2 tablets on day 1 01/04/22  Yes Rozlyn Yerby, DO  benzonatate (TESSALON) 100 MG capsule Take 1 capsule (100 mg total) by mouth every 8 (eight) hours. 01/04/22  Yes Bearett Porcaro, DO  cefdinir  (OMNICEF) 300 MG capsule Take 1 capsule (300 mg total) by mouth 2 (two) times daily for 10 days. 01/04/22 01/14/22 Yes Tonji Elliff, DO  predniSONE (STERAPRED UNI-PAK 21 TAB) 10 MG (21) TBPK tablet Take by mouth daily. Take 6 tabs by mouth daily for 1, then 5 tabs for 1 day, then 4 tabs for 1 day, then 3 tabs for 1 day, then 2 tabs for 1 day, then 1 tab for 1 day. 01/04/22  Yes Soul Deveney, DO  acyclovir (ZOVIRAX) 200 MG capsule Take 1 capsule (200 mg total) by mouth 2 (two) times daily. Patient taking differently: Take 200 mg by mouth daily.  02/28/15   Jerrol Banana., MD  aspirin EC 81 MG tablet Take 81 mg by mouth daily.    [provider]  Cholecalciferol (VITAMIN D PO) Take 5,000 Units by mouth daily.     [provider]  doxylamine, Sleep, (UNISOM) 25 MG tablet Take 25 mg by mouth at bedtime.    [provider]  guaiFENesin-codeine (CHERATUSSIN AC) 100-10 MG/5ML syrup Take 10 mLs by mouth 3 (three) times daily as needed for cough. 01/04/22   Lyndee Hensen, DO  hydrochlorothiazide (MICROZIDE) 12.5 MG capsule Take 12.5 mg by mouth daily.    [provider]  Melatonin  5 MG TABS Take 5 mg by mouth at bedtime.    [provider]  Multiple Vitamin (MULTIVITAMIN) capsule Take 1 capsule by mouth daily.    [provider]  Nutritional Supplements (PYCNOGENOL PO) Take 50 mg by mouth daily.    [provider]  OVER THE COUNTER MEDICATION Take 1 capsule by mouth daily. Juice Plus Fruit    [provider]  OVER THE COUNTER MEDICATION Take 1 capsule by mouth daily. Juice plus Veggie    [provider]  rosuvastatin (CRESTOR) 20 MG tablet Take 20 mg by mouth every morning.    [provider]  Ubiquinol 100 MG CAPS Take 1 capsule by mouth daily.    [provider]  vitamin E (VITAMIN E) 400 UNIT capsule Take 800 Units by mouth daily.    [provider]    Family History Family History   Problem Relation Age of Onset   Hyperlipidemia Mother    Glaucoma Mother    Pulmonary fibrosis Mother    Heart attack Father    Other Brother        Enlarged prostate    Social History Social History   Tobacco Use   Smoking status: Former    Packs/day: 1.00    Years: 18.00    Total pack years: 18.00    Types: Cigarettes   Smokeless tobacco: Never   Tobacco comments:    Pt quit 40 years ago  Vaping Use   Vaping Use: Never used  Substance Use Topics   Alcohol use: Yes   Drug use: No     Allergies   Sulfa antibiotics   Review of Systems Review of Systems: negative unless otherwise stated in HPI.      Physical Exam Triage Vital Signs ED Triage Vitals  Enc Vitals Group     BP 01/04/22 0831 (!) 147/84     Pulse Rate 01/04/22 0831 (!) 55     Resp --      Temp 01/04/22 0831 98 F (36.7 C)     Temp Source 01/04/22 0831 Oral     SpO2 01/04/22 0831 94 %     Weight 01/04/22 0828 204 lb (92.5 kg)     Height 01/04/22 0828 '5\' 11"'$  (1.803 m)     Head Circumference --      Peak Flow --      Pain Score 01/04/22 0828 0     Pain Loc --      Pain Edu? --      Excl. in [place]? --    No data found.  Updated Vital Signs BP (!) 147/84 (BP Location: Left Arm)   Pulse (!) 55   Temp 98 F (36.7 C) (Oral)   Ht '5\' 11"'$  (1.803 m)   Wt 92.5 kg   SpO2 94%   BMI 28.45 kg/m   Visual Acuity Right Eye Distance:   Left Eye Distance:   Bilateral Distance:    Right Eye Near:   Left Eye Near:    Bilateral Near:     Physical Exam GEN:     alert, non-toxic appearing male in no distress     HENT:  mucus membranes moist, no nasal discharge EYES:   pupils equal and reactive, EOMi, no scleral injection NECK:  normal RO RESP:  no increased work of breathing, no retractions, no respiratory distress, left lower lung rales, right lung with rhonchi, no wheezing CVS:   regular rate and rhythm Skin:   warm  and dry, no rash on visible skin, normal skin turgor    UC Treatments /  Results  Labs (all labs ordered are listed, but only abnormal results are displayed) Labs Reviewed - No data to display  EKG   Radiology No results found.  Procedures Procedures (including critical care time)  Medications Ordered in UC Medications - No data to display  Initial Impression / Assessment and Plan / UC Course  I have reviewed the triage vital signs and the nursing notes.  Pertinent labs & imaging results that were available during my care of the patient were reviewed by me and considered in my medical decision making (see chart for details).       Pt is a 70 y.o. male former smoker who presents for 1-2 months of cough that is not improving.  Eduardo Patrick is  afebrile here without recent antipyretics. Satting 94%  on room air. Overall pt is  non-toxic appearing, well hydrated, without respiratory distress. Pulmonary exam is remarkable for left lower rales concerning for pneumonia.  After shared decision making we will not pursue chest x-ray at this time as it currently would not change management.  COVID  and influenza testing deferred due to length of symptoms. Treat with steroids and antibiotics as below. Tessalon Perles and cough syrup given for cough.  Typical duration of symptoms discussed.   He is hypertensive today and we are starting steroids.  He states that he is out of his amlodipine.  Refilled amlodipine and advised to take 2 tablets for total of 5 mg while taking steroids over the next week.  Return and ED precautions given and patient voiced understanding. Discussed MDM, treatment plan and plan for follow-up with patient who agrees with plan.      Final Clinical Impressions(s) / UC Diagnoses   Final diagnoses:  Community acquired pneumonia of left lower lobe of lung     Discharge Instructions      We are treating you for pneumonia.  Stop by the pharmacy to take your antibiotics and steroids.  While you take steroids, take 5 mg (2 tablets of your 2.5 mg  amlodipine) daily for the next week.      ED Prescriptions     Medication Sig Dispense Auth. Provider   amLODipine (NORVASC) 2.5 MG tablet Take 1 tablet (2.5 mg total) by mouth daily. 90 tablet Camry Robello, DO   benzonatate (TESSALON) 100 MG capsule Take 1 capsule (100 mg total) by mouth every 8 (eight) hours. 21 capsule Johnita Palleschi, DO   azithromycin (ZITHROMAX Z-PAK) 250 MG tablet Take 1 tablet (250 mg total) by mouth daily. Take 2 tablets on day 1 6 tablet Kameren Baade, DO   cefdinir (OMNICEF) 300 MG capsule Take 1 capsule (300 mg total) by mouth 2 (two) times daily for 10 days. 20 capsule Kaileen Bronkema, DO   guaiFENesin-codeine (CHERATUSSIN AC) 100-10 MG/5ML syrup Take 10 mLs by mouth 3 (three) times daily as needed for cough. 120 mL Isador Castille, DO   predniSONE (STERAPRED UNI-PAK 21 TAB) 10 MG (21) TBPK tablet Take by mouth daily. Take 6 tabs by mouth daily for 1, then 5 tabs for 1 day, then 4 tabs for 1 day, then 3 tabs for 1 day, then 2 tabs for 1 day, then 1 tab for 1 day. 21 tablet Jenina Moening, DO      I have reviewed the PDMP during this encounter.

## 2022-01-04 NOTE — ED Triage Notes (Signed)
Patient reports that he was here about 8 days ago and he is still having a lot of chest congestion. Patient reports that he did complete his antibiotic and has been using the cough medication that was given to him at his last visit with no help

## 2022-01-04 NOTE — Discharge Instructions (Addendum)
We are treating you for pneumonia.  Stop by the pharmacy to take your antibiotics and steroids.  While you take steroids, take 5 mg (2 tablets of your 2.5 mg amlodipine) daily for the next week.

## 2022-01-12 ENCOUNTER — Other Ambulatory Visit: Payer: Self-pay

## 2022-01-12 ENCOUNTER — Ambulatory Visit
Admission: RE | Admit: 2022-01-12 | Discharge: 2022-01-12 | Disposition: A | Discharge status: Home / Self Care | Payer: Medicare Other | Source: Ambulatory Visit | Attending: Family Medicine | Admitting: Family Medicine

## 2022-01-12 DIAGNOSIS — R509 Fever, unspecified: Secondary | ICD-10-CM | POA: Diagnosis present

## 2022-01-12 DIAGNOSIS — R053 Chronic cough: Secondary | ICD-10-CM | POA: Diagnosis present

## 2022-01-12 LAB — POCT I-STAT CREATININE: Creatinine, Ser: 0.8 mg/dL (ref 0.61–1.24)

## 2022-01-12 MED ORDER — IOHEXOL 350 MG/ML SOLN
75.0000 mL | Freq: Once | INTRAVENOUS | Status: AC | PRN
  Administered 2022-01-12: 75 mL via INTRAVENOUS

## 2023-06-16 ENCOUNTER — Other Ambulatory Visit: Payer: Self-pay | Admitting: Physician Assistant

## 2023-06-16 ENCOUNTER — Ambulatory Visit
Admission: RE | Admit: 2023-06-16 | Discharge: 2023-06-16 | Disposition: A | Discharge status: Home / Self Care | Source: Ambulatory Visit | Attending: Physician Assistant | Admitting: Physician Assistant

## 2023-06-16 DIAGNOSIS — R1031 Right lower quadrant pain: Secondary | ICD-10-CM

## 2023-06-16 DIAGNOSIS — R109 Unspecified abdominal pain: Secondary | ICD-10-CM | POA: Diagnosis present

## 2023-06-16 DIAGNOSIS — R1011 Right upper quadrant pain: Secondary | ICD-10-CM | POA: Insufficient documentation

## 2024-01-30 ENCOUNTER — Ambulatory Visit: Admission: EM | Admit: 2024-01-30 | Discharge: 2024-01-30 | Disposition: A | Discharge status: Home / Self Care

## 2024-01-30 ENCOUNTER — Encounter: Payer: Self-pay | Admitting: Emergency Medicine

## 2024-01-30 DIAGNOSIS — R051 Acute cough: Secondary | ICD-10-CM | POA: Diagnosis not present

## 2024-01-30 DIAGNOSIS — J22 Unspecified acute lower respiratory infection: Secondary | ICD-10-CM

## 2024-01-30 MED ORDER — DOXYCYCLINE HYCLATE 100 MG PO CAPS: 100.0000 mg | ORAL_CAPSULE | Freq: Two times a day (BID) | ORAL | 0 refills | Status: AC

## 2024-01-30 MED ORDER — PROMETHAZINE-DM 6.25-15 MG/5ML PO SYRP: 5.0000 mL | ORAL_SOLUTION | Freq: Four times a day (QID) | ORAL | 0 refills | Status: AC | PRN

## 2024-01-30 NOTE — Discharge Instructions (Addendum)
 Take antibiotic as directed(doxycycline ), Phenergan  Dm for cough-drowsiness precautions). Rest,push fluids. May use OTC meds of choice(Mucinex, tylenol ,etc) as label directed. Follow up with PCP in 3 days if no improvement,sooner if worse.

## 2024-01-30 NOTE — ED Triage Notes (Signed)
 Patient c/o nonproductive cough and chest congestion for a week.  Patient denies fevers.

## 2024-01-30 NOTE — ED Provider Notes (Signed)
 [facility]    CSN: [id number] Arrival date & time: 01/30/24  0847      History   Chief Complaint Chief Complaint  Patient presents with   Cough    HPI OVID WITMAN is a 73 y.o. male.   73 year old male pt, Kadarious Dikes, dents to urgent care for evaluation of persistent cough and nasal drainage for 1 week. No treatment tried. Works at [employer], unknown illness exposure.   The history is provided by the patient. No language interpreter was used.    Past Medical History:  Diagnosis Date   Bronchitis    Cluster headache    Erectile dysfunction    Eustachian tube dysfunction    Fatigue    Genital herpes    GERD (gastroesophageal reflux disease)    Hematuria    Hemorrhoid    Hyperlipidemia    Hypertension    Insomnia    Knee bursitis    Onychomycosis    Psoriasis    Vitamin D deficiency     Patient Active Problem List   Diagnosis Date Noted   Acute respiratory infection 01/30/2024   Acute cough 01/30/2024   Hypertension 07/19/2014   GERD (gastroesophageal reflux disease) 07/19/2014   Erectile dysfunction 07/19/2014   Insomnia 07/19/2014    Past Surgical History:  Procedure Laterality Date   INGUINAL HERNIA REPAIR Bilateral 09/06/2017   Procedure: LAPAROSCOPIC BILATERAL INGUINAL HERNIA REPAIR;  Surgeon: Rodolph Romano, MD;  Location: [facility] OR[facility];  Service: General;  Laterality: Bilateral;   SHOULDER SURGERY     X3   UMBILICAL HERNIA REPAIR         Home Medications    Prior to Admission medications  Medication Sig Start Date End Date Taking? Authorizing Provider  amLODipine  (NORVASC ) 5 MG tablet Take 5 mg by mouth daily. 01/21/24 02/20/24 Yes [provider]  doxycycline  (VIBRAMYCIN ) 100 MG capsule Take 1 capsule (100 mg total) by mouth 2 (two) times daily for 7 days. 01/30/24 02/06/24 Yes Bryndan Bilyk, NP  promethazine -dextromethorphan (PROMETHAZINE -DM) 6.25-15 MG/5ML syrup Take 5 mLs by mouth 4 (four) times daily as  needed for cough. 01/30/24  Yes Cailen Texeira, Rilla, NP  acyclovir  (ZOVIRAX ) 200 MG capsule Take 1 capsule (200 mg total) by mouth 2 (two) times daily. Patient taking differently: Take 200 mg by mouth daily.  02/28/15   Bertrum Charlie CROME, MD  amLODipine  (NORVASC ) 2.5 MG tablet Take 1 tablet (2.5 mg total) by mouth daily. 01/04/22   Brimage, Vondra, DO  aspirin EC 81 MG tablet Take 81 mg by mouth daily.    [provider]  azithromycin  (ZITHROMAX  Z-PAK) 250 MG tablet Take 1 tablet (250 mg total) by mouth daily. Take 2 tablets on day 1 01/04/22   Brimage, Vondra, DO  benzonatate  (TESSALON ) 100 MG capsule Take 1 capsule (100 mg total) by mouth every 8 (eight) hours. 01/04/22   Brimage, Vondra, DO  Cholecalciferol (VITAMIN D PO) Take 5,000 Units by mouth daily.     [provider]  doxylamine, Sleep, (UNISOM) 25 MG tablet Take 25 mg by mouth at bedtime.    [provider]  guaiFENesin-codeine (CHERATUSSIN AC) 100-10 MG/5ML syrup Take 10 mLs by mouth 3 (three) times daily as needed for cough. 01/04/22   Brimage, Vondra, DO  hydrochlorothiazide (MICROZIDE) 12.5 MG capsule Take 25 mg by mouth daily.    [provider]  Melatonin 5 MG TABS Take 5 mg by mouth at bedtime.    [provider]  Multiple  Vitamin (MULTIVITAMIN) capsule Take 1 capsule by mouth daily.    [provider]  Nutritional Supplements (PYCNOGENOL PO) Take 50 mg by mouth daily.    [provider]  OVER THE COUNTER MEDICATION Take 1 capsule by mouth daily. Juice Plus Fruit    [provider]  OVER THE COUNTER MEDICATION Take 1 capsule by mouth daily. Juice plus Veggie    [provider]  predniSONE  (STERAPRED UNI-PAK 21 TAB) 10 MG (21) TBPK tablet Take by mouth daily. Take 6 tabs by mouth daily for 1, then 5 tabs for 1 day, then 4 tabs for 1 day, then 3 tabs for 1 day, then 2 tabs for 1 day, then 1 tab for 1 day. 01/04/22   Brimage, Vondra, DO  rosuvastatin  (CRESTOR) 20 MG tablet Take 20 mg by mouth every morning.    [provider]  Ubiquinol 100 MG CAPS Take 1 capsule by mouth daily.    [provider]  vitamin E (VITAMIN E) 400 UNIT capsule Take 800 Units by mouth daily.    [provider]    Family History Family History  Problem Relation Age of Onset   Hyperlipidemia Mother    Glaucoma Mother    Pulmonary fibrosis Mother    Heart attack Father    Other Brother        Enlarged prostate    Social History Social History[1]   Allergies   Sulfa antibiotics   Review of Systems Review of Systems  Constitutional:  Negative for fever.  HENT:  Positive for congestion, sinus pressure and sinus pain.   Respiratory:  Positive for cough.   All other systems reviewed and are negative.    Physical Exam Triage Vital Signs ED Triage Vitals  Encounter Vitals Group     BP      Girls Systolic BP Percentile      Girls Diastolic BP Percentile      Boys Systolic BP Percentile      Boys Diastolic BP Percentile      Pulse      Resp      Temp      Temp src      SpO2      Weight      Height      Head Circumference      Peak Flow      Pain Score      Pain Loc      Pain Education      Exclude from Growth Chart    No data found.  Updated Vital Signs BP 129/83 (BP Location: Right Arm)   Pulse 65   Temp 98.5 F (36.9 C) (Oral)   Resp 15   Ht 5' 11 (1.803 m)   Wt 203 lb 14.8 oz (92.5 kg)   SpO2 96%   BMI 28.44 kg/m   Visual Acuity Right Eye Distance:   Left Eye Distance:   Bilateral Distance:    Right Eye Near:   Left Eye Near:    Bilateral Near:     Physical Exam Vitals and nursing note reviewed.  Constitutional:      General: He is not in acute distress.    Appearance: He is well-developed. He is not ill-appearing or toxic-appearing.  HENT:     Head: Normocephalic.     Right Ear: Tympanic membrane is retracted.     Left Ear: Tympanic membrane is retracted.     Nose: Mucosal edema  and congestion present.  Right Sinus: Maxillary sinus tenderness present.     Left Sinus: Maxillary sinus tenderness present.     Mouth/Throat:     Lips: Pink.     Mouth: Mucous membranes are moist.     Pharynx: Oropharynx is clear. Uvula midline. Postnasal drip present.  Eyes:     General: Lids are normal.     Conjunctiva/sclera: Conjunctivae normal.     Pupils: Pupils are equal, round, and reactive to light.  Cardiovascular:     Rate and Rhythm: Normal rate and regular rhythm.     Heart sounds: Normal heart sounds.  Pulmonary:     Effort: Pulmonary effort is normal. No respiratory distress.     Breath sounds: Normal breath sounds and air entry. No decreased breath sounds or wheezing.  Abdominal:     General: There is no distension.     Palpations: Abdomen is soft.  Musculoskeletal:        General: Normal range of motion.     Cervical back: Normal range of motion.  Skin:    General: Skin is warm and dry.     Findings: No rash.  Neurological:     General: No focal deficit present.     Mental Status: He is alert and oriented to person, place, and time.     GCS: GCS eye subscore is 4. GCS verbal subscore is 5. GCS motor subscore is 6.     Cranial Nerves: No cranial nerve deficit.     Sensory: No sensory deficit.  Psychiatric:        Attention and Perception: Attention normal.        Mood and Affect: Mood normal.        Speech: Speech normal.        Behavior: Behavior normal. Behavior is cooperative.      UC Treatments / Results  Labs (all labs ordered are listed, but only abnormal results are displayed) Labs Reviewed - No data to display  EKG   Radiology No results found.  Procedures Procedures (including critical care time)  Medications Ordered in UC Medications - No data to display  Initial Impression / Assessment and Plan / UC Course  I have reviewed the triage vital signs and the nursing notes.  Pertinent labs & imaging results that were available  during my care of the patient were reviewed by me and considered in my medical decision making (see chart for details).    Discussed exam findings and plan of care with patient, doxycycline  and phenergan , strict go to ER precautions given.   Patient verbalized understanding to this provider.  Ddx: Acute respiratory infection, cough,allergies, viral illness Final Clinical Impressions(s) / UC Diagnoses   Final diagnoses:  Acute respiratory infection  Acute cough     Discharge Instructions      Take antibiotic as directed(doxycycline ), Phenergan  Dm for cough-drowsiness precautions). Rest,push fluids. May use OTC meds of choice(Mucinex, tylenol ,etc) as label directed. Follow up with PCP in 3 days if no improvement,sooner if worse.     ED Prescriptions     Medication Sig Dispense Auth. Provider   doxycycline  (VIBRAMYCIN ) 100 MG capsule Take 1 capsule (100 mg total) by mouth 2 (two) times daily for 7 days. 14 capsule Tyrus Wilms, NP   promethazine -dextromethorphan (PROMETHAZINE -DM) 6.25-15 MG/5ML syrup Take 5 mLs by mouth 4 (four) times daily as needed for cough. 118 mL Zuri Bradway, NP      PDMP not reviewed this encounter.     [1]  Social History  Tobacco Use   Smoking status: Former    Current packs/day: 1.00    Average packs/day: 1 pack/day for 18.0 years (18.0 ttl pk-yrs)    Types: Cigarettes   Smokeless tobacco: Never   Tobacco comments:    Pt quit 40 years ago  Vaping Use   Vaping status: Never Used  Substance Use Topics   Alcohol use: Yes   Drug use: No     Cono Gebhard, Rilla, NP 01/30/24 0915
